# Patient Record
Sex: Male | Born: 1940
Health system: Southern US, Community
[De-identification: ages and names within clinical notes are randomized; demographics above are authoritative.]

## PROBLEM LIST (undated history)

## (undated) DIAGNOSIS — T8859XA Other complications of anesthesia, initial encounter: Secondary | ICD-10-CM

## (undated) DIAGNOSIS — I10 Essential (primary) hypertension: Secondary | ICD-10-CM

## (undated) DIAGNOSIS — J449 Chronic obstructive pulmonary disease, unspecified: Secondary | ICD-10-CM

## (undated) DIAGNOSIS — K4090 Unilateral inguinal hernia, without obstruction or gangrene, not specified as recurrent: Secondary | ICD-10-CM

## (undated) DIAGNOSIS — E119 Type 2 diabetes mellitus without complications: Secondary | ICD-10-CM

## (undated) HISTORY — PX: SHOULDER SURGERY: SHX246

## (undated) HISTORY — PX: APPENDECTOMY: SHX54

## (undated) HISTORY — PX: INGUINAL HERNIA REPAIR: SUR1180

---

## 2011-02-06 DIAGNOSIS — E876 Hypokalemia: Secondary | ICD-10-CM | POA: Diagnosis not present

## 2011-02-06 DIAGNOSIS — E291 Testicular hypofunction: Secondary | ICD-10-CM | POA: Diagnosis not present

## 2011-02-06 DIAGNOSIS — E7800 Pure hypercholesterolemia, unspecified: Secondary | ICD-10-CM | POA: Diagnosis not present

## 2011-02-06 DIAGNOSIS — I10 Essential (primary) hypertension: Secondary | ICD-10-CM | POA: Diagnosis not present

## 2011-02-13 DIAGNOSIS — Z79899 Other long term (current) drug therapy: Secondary | ICD-10-CM | POA: Diagnosis not present

## 2011-02-13 DIAGNOSIS — Z5181 Encounter for therapeutic drug level monitoring: Secondary | ICD-10-CM | POA: Diagnosis not present

## 2011-02-13 DIAGNOSIS — I10 Essential (primary) hypertension: Secondary | ICD-10-CM | POA: Diagnosis not present

## 2011-02-20 DIAGNOSIS — E7800 Pure hypercholesterolemia, unspecified: Secondary | ICD-10-CM | POA: Diagnosis not present

## 2011-02-20 DIAGNOSIS — E119 Type 2 diabetes mellitus without complications: Secondary | ICD-10-CM | POA: Diagnosis not present

## 2011-02-20 DIAGNOSIS — E291 Testicular hypofunction: Secondary | ICD-10-CM | POA: Diagnosis not present

## 2011-02-20 DIAGNOSIS — N19 Unspecified kidney failure: Secondary | ICD-10-CM | POA: Diagnosis not present

## 2011-02-24 DIAGNOSIS — H2510 Age-related nuclear cataract, unspecified eye: Secondary | ICD-10-CM | POA: Diagnosis not present

## 2011-02-24 DIAGNOSIS — E119 Type 2 diabetes mellitus without complications: Secondary | ICD-10-CM | POA: Diagnosis not present

## 2011-03-04 DIAGNOSIS — N179 Acute kidney failure, unspecified: Secondary | ICD-10-CM | POA: Diagnosis not present

## 2011-03-11 DIAGNOSIS — I129 Hypertensive chronic kidney disease with stage 1 through stage 4 chronic kidney disease, or unspecified chronic kidney disease: Secondary | ICD-10-CM | POA: Diagnosis not present

## 2011-03-11 DIAGNOSIS — N179 Acute kidney failure, unspecified: Secondary | ICD-10-CM | POA: Diagnosis not present

## 2011-03-11 DIAGNOSIS — N2581 Secondary hyperparathyroidism of renal origin: Secondary | ICD-10-CM | POA: Diagnosis not present

## 2011-03-17 DIAGNOSIS — J309 Allergic rhinitis, unspecified: Secondary | ICD-10-CM | POA: Diagnosis not present

## 2011-03-17 DIAGNOSIS — J61 Pneumoconiosis due to asbestos and other mineral fibers: Secondary | ICD-10-CM | POA: Diagnosis not present

## 2011-04-03 DIAGNOSIS — E291 Testicular hypofunction: Secondary | ICD-10-CM | POA: Diagnosis not present

## 2011-04-03 DIAGNOSIS — E119 Type 2 diabetes mellitus without complications: Secondary | ICD-10-CM | POA: Diagnosis not present

## 2011-04-03 DIAGNOSIS — N19 Unspecified kidney failure: Secondary | ICD-10-CM | POA: Diagnosis not present

## 2011-04-03 DIAGNOSIS — I10 Essential (primary) hypertension: Secondary | ICD-10-CM | POA: Diagnosis not present

## 2011-05-01 DIAGNOSIS — E291 Testicular hypofunction: Secondary | ICD-10-CM | POA: Diagnosis not present

## 2011-05-01 DIAGNOSIS — M545 Low back pain: Secondary | ICD-10-CM | POA: Diagnosis not present

## 2011-05-01 DIAGNOSIS — E119 Type 2 diabetes mellitus without complications: Secondary | ICD-10-CM | POA: Diagnosis not present

## 2011-05-01 DIAGNOSIS — I10 Essential (primary) hypertension: Secondary | ICD-10-CM | POA: Diagnosis not present

## 2011-05-22 DIAGNOSIS — N529 Male erectile dysfunction, unspecified: Secondary | ICD-10-CM | POA: Diagnosis not present

## 2011-05-22 DIAGNOSIS — Z79899 Other long term (current) drug therapy: Secondary | ICD-10-CM | POA: Diagnosis not present

## 2011-05-22 DIAGNOSIS — E782 Mixed hyperlipidemia: Secondary | ICD-10-CM | POA: Diagnosis not present

## 2011-05-22 DIAGNOSIS — I10 Essential (primary) hypertension: Secondary | ICD-10-CM | POA: Diagnosis not present

## 2011-05-22 DIAGNOSIS — E119 Type 2 diabetes mellitus without complications: Secondary | ICD-10-CM | POA: Diagnosis not present

## 2011-05-29 DIAGNOSIS — M545 Low back pain: Secondary | ICD-10-CM | POA: Diagnosis not present

## 2011-05-29 DIAGNOSIS — E291 Testicular hypofunction: Secondary | ICD-10-CM | POA: Diagnosis not present

## 2011-05-29 DIAGNOSIS — E119 Type 2 diabetes mellitus without complications: Secondary | ICD-10-CM | POA: Diagnosis not present

## 2011-05-29 DIAGNOSIS — E7800 Pure hypercholesterolemia, unspecified: Secondary | ICD-10-CM | POA: Diagnosis not present

## 2011-07-07 DIAGNOSIS — J61 Pneumoconiosis due to asbestos and other mineral fibers: Secondary | ICD-10-CM | POA: Diagnosis not present

## 2011-07-07 DIAGNOSIS — J309 Allergic rhinitis, unspecified: Secondary | ICD-10-CM | POA: Diagnosis not present

## 2011-07-31 DIAGNOSIS — E291 Testicular hypofunction: Secondary | ICD-10-CM | POA: Diagnosis not present

## 2011-07-31 DIAGNOSIS — E782 Mixed hyperlipidemia: Secondary | ICD-10-CM | POA: Diagnosis not present

## 2011-07-31 DIAGNOSIS — E119 Type 2 diabetes mellitus without complications: Secondary | ICD-10-CM | POA: Diagnosis not present

## 2011-07-31 DIAGNOSIS — IMO0001 Reserved for inherently not codable concepts without codable children: Secondary | ICD-10-CM | POA: Diagnosis not present

## 2011-07-31 DIAGNOSIS — E7800 Pure hypercholesterolemia, unspecified: Secondary | ICD-10-CM | POA: Diagnosis not present

## 2011-07-31 DIAGNOSIS — I10 Essential (primary) hypertension: Secondary | ICD-10-CM | POA: Diagnosis not present

## 2011-07-31 DIAGNOSIS — Z79899 Other long term (current) drug therapy: Secondary | ICD-10-CM | POA: Diagnosis not present

## 2011-09-17 DIAGNOSIS — E291 Testicular hypofunction: Secondary | ICD-10-CM | POA: Diagnosis not present

## 2011-09-17 DIAGNOSIS — E119 Type 2 diabetes mellitus without complications: Secondary | ICD-10-CM | POA: Diagnosis not present

## 2011-09-17 DIAGNOSIS — E782 Mixed hyperlipidemia: Secondary | ICD-10-CM | POA: Diagnosis not present

## 2011-09-17 DIAGNOSIS — I10 Essential (primary) hypertension: Secondary | ICD-10-CM | POA: Diagnosis not present

## 2011-10-07 DIAGNOSIS — J0100 Acute maxillary sinusitis, unspecified: Secondary | ICD-10-CM | POA: Diagnosis not present

## 2011-10-07 DIAGNOSIS — J209 Acute bronchitis, unspecified: Secondary | ICD-10-CM | POA: Diagnosis not present

## 2011-10-16 DIAGNOSIS — E291 Testicular hypofunction: Secondary | ICD-10-CM | POA: Diagnosis not present

## 2011-11-04 DIAGNOSIS — J61 Pneumoconiosis due to asbestos and other mineral fibers: Secondary | ICD-10-CM | POA: Diagnosis not present

## 2011-11-04 DIAGNOSIS — J45909 Unspecified asthma, uncomplicated: Secondary | ICD-10-CM | POA: Diagnosis not present

## 2011-11-04 DIAGNOSIS — J3089 Other allergic rhinitis: Secondary | ICD-10-CM | POA: Diagnosis not present

## 2011-11-16 DIAGNOSIS — E291 Testicular hypofunction: Secondary | ICD-10-CM | POA: Diagnosis not present

## 2011-11-27 DIAGNOSIS — E782 Mixed hyperlipidemia: Secondary | ICD-10-CM | POA: Diagnosis not present

## 2011-11-27 DIAGNOSIS — E119 Type 2 diabetes mellitus without complications: Secondary | ICD-10-CM | POA: Diagnosis not present

## 2011-11-27 DIAGNOSIS — I10 Essential (primary) hypertension: Secondary | ICD-10-CM | POA: Diagnosis not present

## 2011-11-27 DIAGNOSIS — Z23 Encounter for immunization: Secondary | ICD-10-CM | POA: Diagnosis not present

## 2011-12-22 DIAGNOSIS — E291 Testicular hypofunction: Secondary | ICD-10-CM | POA: Diagnosis not present

## 2012-01-26 DIAGNOSIS — E291 Testicular hypofunction: Secondary | ICD-10-CM | POA: Diagnosis not present

## 2012-03-01 DIAGNOSIS — E291 Testicular hypofunction: Secondary | ICD-10-CM | POA: Diagnosis not present

## 2012-03-10 DIAGNOSIS — N2581 Secondary hyperparathyroidism of renal origin: Secondary | ICD-10-CM | POA: Diagnosis not present

## 2012-03-10 DIAGNOSIS — R5381 Other malaise: Secondary | ICD-10-CM | POA: Diagnosis not present

## 2012-03-10 DIAGNOSIS — N179 Acute kidney failure, unspecified: Secondary | ICD-10-CM | POA: Diagnosis not present

## 2012-03-10 DIAGNOSIS — N183 Chronic kidney disease, stage 3 unspecified: Secondary | ICD-10-CM | POA: Diagnosis not present

## 2012-03-16 DIAGNOSIS — D649 Anemia, unspecified: Secondary | ICD-10-CM | POA: Diagnosis not present

## 2012-03-16 DIAGNOSIS — N2581 Secondary hyperparathyroidism of renal origin: Secondary | ICD-10-CM | POA: Diagnosis not present

## 2012-03-16 DIAGNOSIS — I129 Hypertensive chronic kidney disease with stage 1 through stage 4 chronic kidney disease, or unspecified chronic kidney disease: Secondary | ICD-10-CM | POA: Diagnosis not present

## 2012-03-22 DIAGNOSIS — J3089 Other allergic rhinitis: Secondary | ICD-10-CM | POA: Diagnosis not present

## 2012-03-22 DIAGNOSIS — J45909 Unspecified asthma, uncomplicated: Secondary | ICD-10-CM | POA: Diagnosis not present

## 2012-03-22 DIAGNOSIS — J61 Pneumoconiosis due to asbestos and other mineral fibers: Secondary | ICD-10-CM | POA: Diagnosis not present

## 2012-03-28 DIAGNOSIS — H2510 Age-related nuclear cataract, unspecified eye: Secondary | ICD-10-CM | POA: Diagnosis not present

## 2012-04-01 DIAGNOSIS — I10 Essential (primary) hypertension: Secondary | ICD-10-CM | POA: Diagnosis not present

## 2012-04-01 DIAGNOSIS — E782 Mixed hyperlipidemia: Secondary | ICD-10-CM | POA: Diagnosis not present

## 2012-04-01 DIAGNOSIS — E1165 Type 2 diabetes mellitus with hyperglycemia: Secondary | ICD-10-CM | POA: Diagnosis not present

## 2012-04-01 DIAGNOSIS — E291 Testicular hypofunction: Secondary | ICD-10-CM | POA: Diagnosis not present

## 2012-04-01 DIAGNOSIS — E119 Type 2 diabetes mellitus without complications: Secondary | ICD-10-CM | POA: Diagnosis not present

## 2012-04-01 DIAGNOSIS — E1129 Type 2 diabetes mellitus with other diabetic kidney complication: Secondary | ICD-10-CM | POA: Diagnosis not present

## 2012-04-13 DIAGNOSIS — N189 Chronic kidney disease, unspecified: Secondary | ICD-10-CM | POA: Diagnosis not present

## 2012-04-13 DIAGNOSIS — R748 Abnormal levels of other serum enzymes: Secondary | ICD-10-CM | POA: Diagnosis not present

## 2012-04-13 DIAGNOSIS — E1165 Type 2 diabetes mellitus with hyperglycemia: Secondary | ICD-10-CM | POA: Diagnosis not present

## 2012-04-13 DIAGNOSIS — E1129 Type 2 diabetes mellitus with other diabetic kidney complication: Secondary | ICD-10-CM | POA: Diagnosis not present

## 2012-04-14 DIAGNOSIS — J209 Acute bronchitis, unspecified: Secondary | ICD-10-CM | POA: Diagnosis not present

## 2012-04-14 DIAGNOSIS — J069 Acute upper respiratory infection, unspecified: Secondary | ICD-10-CM | POA: Diagnosis not present

## 2012-04-14 DIAGNOSIS — E119 Type 2 diabetes mellitus without complications: Secondary | ICD-10-CM | POA: Diagnosis not present

## 2012-05-02 DIAGNOSIS — N189 Chronic kidney disease, unspecified: Secondary | ICD-10-CM | POA: Diagnosis not present

## 2012-05-02 DIAGNOSIS — E291 Testicular hypofunction: Secondary | ICD-10-CM | POA: Diagnosis not present

## 2012-06-01 DIAGNOSIS — E291 Testicular hypofunction: Secondary | ICD-10-CM | POA: Diagnosis not present

## 2012-06-17 DIAGNOSIS — J61 Pneumoconiosis due to asbestos and other mineral fibers: Secondary | ICD-10-CM | POA: Diagnosis not present

## 2012-06-17 DIAGNOSIS — J45909 Unspecified asthma, uncomplicated: Secondary | ICD-10-CM | POA: Diagnosis not present

## 2012-06-17 DIAGNOSIS — J309 Allergic rhinitis, unspecified: Secondary | ICD-10-CM | POA: Diagnosis not present

## 2012-07-04 DIAGNOSIS — E291 Testicular hypofunction: Secondary | ICD-10-CM | POA: Diagnosis not present

## 2012-07-25 DIAGNOSIS — E1129 Type 2 diabetes mellitus with other diabetic kidney complication: Secondary | ICD-10-CM | POA: Diagnosis not present

## 2012-07-25 DIAGNOSIS — E1165 Type 2 diabetes mellitus with hyperglycemia: Secondary | ICD-10-CM | POA: Diagnosis not present

## 2012-07-25 DIAGNOSIS — E782 Mixed hyperlipidemia: Secondary | ICD-10-CM | POA: Diagnosis not present

## 2012-07-25 DIAGNOSIS — I10 Essential (primary) hypertension: Secondary | ICD-10-CM | POA: Diagnosis not present

## 2012-08-01 DIAGNOSIS — Z1331 Encounter for screening for depression: Secondary | ICD-10-CM | POA: Diagnosis not present

## 2012-08-01 DIAGNOSIS — E291 Testicular hypofunction: Secondary | ICD-10-CM | POA: Diagnosis not present

## 2012-08-01 DIAGNOSIS — E782 Mixed hyperlipidemia: Secondary | ICD-10-CM | POA: Diagnosis not present

## 2012-08-01 DIAGNOSIS — Z139 Encounter for screening, unspecified: Secondary | ICD-10-CM | POA: Diagnosis not present

## 2012-08-01 DIAGNOSIS — E1129 Type 2 diabetes mellitus with other diabetic kidney complication: Secondary | ICD-10-CM | POA: Diagnosis not present

## 2012-08-01 DIAGNOSIS — I10 Essential (primary) hypertension: Secondary | ICD-10-CM | POA: Diagnosis not present

## 2012-09-01 DIAGNOSIS — E291 Testicular hypofunction: Secondary | ICD-10-CM | POA: Diagnosis not present

## 2012-09-30 DIAGNOSIS — J45909 Unspecified asthma, uncomplicated: Secondary | ICD-10-CM | POA: Diagnosis not present

## 2012-09-30 DIAGNOSIS — Z23 Encounter for immunization: Secondary | ICD-10-CM | POA: Diagnosis not present

## 2012-09-30 DIAGNOSIS — J61 Pneumoconiosis due to asbestos and other mineral fibers: Secondary | ICD-10-CM | POA: Diagnosis not present

## 2012-09-30 DIAGNOSIS — J309 Allergic rhinitis, unspecified: Secondary | ICD-10-CM | POA: Diagnosis not present

## 2012-10-06 DIAGNOSIS — Z23 Encounter for immunization: Secondary | ICD-10-CM | POA: Diagnosis not present

## 2012-10-06 DIAGNOSIS — E291 Testicular hypofunction: Secondary | ICD-10-CM | POA: Diagnosis not present

## 2012-11-07 DIAGNOSIS — E291 Testicular hypofunction: Secondary | ICD-10-CM | POA: Diagnosis not present

## 2012-12-05 DIAGNOSIS — Z125 Encounter for screening for malignant neoplasm of prostate: Secondary | ICD-10-CM | POA: Diagnosis not present

## 2012-12-05 DIAGNOSIS — E782 Mixed hyperlipidemia: Secondary | ICD-10-CM | POA: Diagnosis not present

## 2012-12-05 DIAGNOSIS — E1129 Type 2 diabetes mellitus with other diabetic kidney complication: Secondary | ICD-10-CM | POA: Diagnosis not present

## 2012-12-05 DIAGNOSIS — I10 Essential (primary) hypertension: Secondary | ICD-10-CM | POA: Diagnosis not present

## 2012-12-06 DIAGNOSIS — E875 Hyperkalemia: Secondary | ICD-10-CM | POA: Diagnosis not present

## 2012-12-12 DIAGNOSIS — I10 Essential (primary) hypertension: Secondary | ICD-10-CM | POA: Diagnosis not present

## 2012-12-12 DIAGNOSIS — E291 Testicular hypofunction: Secondary | ICD-10-CM | POA: Diagnosis not present

## 2012-12-12 DIAGNOSIS — E782 Mixed hyperlipidemia: Secondary | ICD-10-CM | POA: Diagnosis not present

## 2012-12-12 DIAGNOSIS — E1129 Type 2 diabetes mellitus with other diabetic kidney complication: Secondary | ICD-10-CM | POA: Diagnosis not present

## 2013-01-16 DIAGNOSIS — E291 Testicular hypofunction: Secondary | ICD-10-CM | POA: Diagnosis not present

## 2013-01-20 DIAGNOSIS — J309 Allergic rhinitis, unspecified: Secondary | ICD-10-CM | POA: Diagnosis not present

## 2013-01-20 DIAGNOSIS — J61 Pneumoconiosis due to asbestos and other mineral fibers: Secondary | ICD-10-CM | POA: Diagnosis not present

## 2013-01-20 DIAGNOSIS — J45909 Unspecified asthma, uncomplicated: Secondary | ICD-10-CM | POA: Diagnosis not present

## 2013-02-16 DIAGNOSIS — E291 Testicular hypofunction: Secondary | ICD-10-CM | POA: Diagnosis not present

## 2013-03-17 DIAGNOSIS — E291 Testicular hypofunction: Secondary | ICD-10-CM | POA: Diagnosis not present

## 2013-04-25 DIAGNOSIS — I10 Essential (primary) hypertension: Secondary | ICD-10-CM | POA: Diagnosis not present

## 2013-04-25 DIAGNOSIS — E1129 Type 2 diabetes mellitus with other diabetic kidney complication: Secondary | ICD-10-CM | POA: Diagnosis not present

## 2013-04-25 DIAGNOSIS — E291 Testicular hypofunction: Secondary | ICD-10-CM | POA: Diagnosis not present

## 2013-04-25 DIAGNOSIS — E782 Mixed hyperlipidemia: Secondary | ICD-10-CM | POA: Diagnosis not present

## 2013-05-02 DIAGNOSIS — E782 Mixed hyperlipidemia: Secondary | ICD-10-CM | POA: Diagnosis not present

## 2013-05-02 DIAGNOSIS — E1129 Type 2 diabetes mellitus with other diabetic kidney complication: Secondary | ICD-10-CM | POA: Diagnosis not present

## 2013-05-02 DIAGNOSIS — Z6830 Body mass index (BMI) 30.0-30.9, adult: Secondary | ICD-10-CM | POA: Diagnosis not present

## 2013-05-02 DIAGNOSIS — I10 Essential (primary) hypertension: Secondary | ICD-10-CM | POA: Diagnosis not present

## 2013-05-12 DIAGNOSIS — J45909 Unspecified asthma, uncomplicated: Secondary | ICD-10-CM | POA: Diagnosis not present

## 2013-05-12 DIAGNOSIS — J61 Pneumoconiosis due to asbestos and other mineral fibers: Secondary | ICD-10-CM | POA: Diagnosis not present

## 2013-05-12 DIAGNOSIS — J309 Allergic rhinitis, unspecified: Secondary | ICD-10-CM | POA: Diagnosis not present

## 2013-06-01 DIAGNOSIS — E559 Vitamin D deficiency, unspecified: Secondary | ICD-10-CM | POA: Diagnosis not present

## 2013-06-01 DIAGNOSIS — I10 Essential (primary) hypertension: Secondary | ICD-10-CM | POA: Diagnosis not present

## 2013-06-01 DIAGNOSIS — Z6830 Body mass index (BMI) 30.0-30.9, adult: Secondary | ICD-10-CM | POA: Diagnosis not present

## 2013-06-01 DIAGNOSIS — N183 Chronic kidney disease, stage 3 unspecified: Secondary | ICD-10-CM | POA: Diagnosis not present

## 2013-06-01 DIAGNOSIS — E291 Testicular hypofunction: Secondary | ICD-10-CM | POA: Diagnosis not present

## 2013-06-08 DIAGNOSIS — D631 Anemia in chronic kidney disease: Secondary | ICD-10-CM | POA: Diagnosis not present

## 2013-06-08 DIAGNOSIS — N183 Chronic kidney disease, stage 3 unspecified: Secondary | ICD-10-CM | POA: Diagnosis not present

## 2013-06-08 DIAGNOSIS — E559 Vitamin D deficiency, unspecified: Secondary | ICD-10-CM | POA: Diagnosis not present

## 2013-06-08 DIAGNOSIS — I129 Hypertensive chronic kidney disease with stage 1 through stage 4 chronic kidney disease, or unspecified chronic kidney disease: Secondary | ICD-10-CM | POA: Diagnosis not present

## 2013-07-03 DIAGNOSIS — E119 Type 2 diabetes mellitus without complications: Secondary | ICD-10-CM | POA: Diagnosis not present

## 2013-07-03 DIAGNOSIS — H2589 Other age-related cataract: Secondary | ICD-10-CM | POA: Diagnosis not present

## 2013-07-11 DIAGNOSIS — I10 Essential (primary) hypertension: Secondary | ICD-10-CM | POA: Diagnosis not present

## 2013-07-11 DIAGNOSIS — E291 Testicular hypofunction: Secondary | ICD-10-CM | POA: Diagnosis not present

## 2013-07-11 DIAGNOSIS — IMO0002 Reserved for concepts with insufficient information to code with codable children: Secondary | ICD-10-CM | POA: Diagnosis not present

## 2013-08-25 DIAGNOSIS — E782 Mixed hyperlipidemia: Secondary | ICD-10-CM | POA: Diagnosis not present

## 2013-08-25 DIAGNOSIS — E1129 Type 2 diabetes mellitus with other diabetic kidney complication: Secondary | ICD-10-CM | POA: Diagnosis not present

## 2013-08-25 DIAGNOSIS — I10 Essential (primary) hypertension: Secondary | ICD-10-CM | POA: Diagnosis not present

## 2013-08-25 DIAGNOSIS — E291 Testicular hypofunction: Secondary | ICD-10-CM | POA: Diagnosis not present

## 2013-09-01 DIAGNOSIS — Z1331 Encounter for screening for depression: Secondary | ICD-10-CM | POA: Diagnosis not present

## 2013-09-05 DIAGNOSIS — J45909 Unspecified asthma, uncomplicated: Secondary | ICD-10-CM | POA: Diagnosis not present

## 2013-09-05 DIAGNOSIS — J309 Allergic rhinitis, unspecified: Secondary | ICD-10-CM | POA: Diagnosis not present

## 2013-09-05 DIAGNOSIS — J61 Pneumoconiosis due to asbestos and other mineral fibers: Secondary | ICD-10-CM | POA: Diagnosis not present

## 2013-09-25 DIAGNOSIS — E291 Testicular hypofunction: Secondary | ICD-10-CM | POA: Diagnosis not present

## 2013-09-29 DIAGNOSIS — I10 Essential (primary) hypertension: Secondary | ICD-10-CM | POA: Diagnosis not present

## 2013-10-25 DIAGNOSIS — Z23 Encounter for immunization: Secondary | ICD-10-CM | POA: Diagnosis not present

## 2013-10-25 DIAGNOSIS — E291 Testicular hypofunction: Secondary | ICD-10-CM | POA: Diagnosis not present

## 2013-11-24 DIAGNOSIS — E291 Testicular hypofunction: Secondary | ICD-10-CM | POA: Diagnosis not present

## 2013-12-11 DIAGNOSIS — Z125 Encounter for screening for malignant neoplasm of prostate: Secondary | ICD-10-CM | POA: Diagnosis not present

## 2013-12-11 DIAGNOSIS — E291 Testicular hypofunction: Secondary | ICD-10-CM | POA: Diagnosis not present

## 2013-12-11 DIAGNOSIS — E1129 Type 2 diabetes mellitus with other diabetic kidney complication: Secondary | ICD-10-CM | POA: Diagnosis not present

## 2013-12-11 DIAGNOSIS — E1165 Type 2 diabetes mellitus with hyperglycemia: Secondary | ICD-10-CM | POA: Diagnosis not present

## 2013-12-11 DIAGNOSIS — E782 Mixed hyperlipidemia: Secondary | ICD-10-CM | POA: Diagnosis not present

## 2013-12-11 DIAGNOSIS — I10 Essential (primary) hypertension: Secondary | ICD-10-CM | POA: Diagnosis not present

## 2013-12-18 DIAGNOSIS — E119 Type 2 diabetes mellitus without complications: Secondary | ICD-10-CM | POA: Diagnosis not present

## 2013-12-18 DIAGNOSIS — I10 Essential (primary) hypertension: Secondary | ICD-10-CM | POA: Diagnosis not present

## 2013-12-18 DIAGNOSIS — B353 Tinea pedis: Secondary | ICD-10-CM | POA: Diagnosis not present

## 2013-12-18 DIAGNOSIS — R972 Elevated prostate specific antigen [PSA]: Secondary | ICD-10-CM | POA: Diagnosis not present

## 2014-01-09 DIAGNOSIS — R972 Elevated prostate specific antigen [PSA]: Secondary | ICD-10-CM | POA: Diagnosis not present

## 2014-01-09 DIAGNOSIS — N401 Enlarged prostate with lower urinary tract symptoms: Secondary | ICD-10-CM | POA: Diagnosis not present

## 2014-01-26 DIAGNOSIS — N401 Enlarged prostate with lower urinary tract symptoms: Secondary | ICD-10-CM | POA: Diagnosis not present

## 2014-01-26 DIAGNOSIS — R972 Elevated prostate specific antigen [PSA]: Secondary | ICD-10-CM | POA: Diagnosis not present

## 2014-02-06 DIAGNOSIS — J0100 Acute maxillary sinusitis, unspecified: Secondary | ICD-10-CM | POA: Diagnosis not present

## 2014-02-23 DIAGNOSIS — J301 Allergic rhinitis due to pollen: Secondary | ICD-10-CM | POA: Diagnosis not present

## 2014-02-23 DIAGNOSIS — J449 Chronic obstructive pulmonary disease, unspecified: Secondary | ICD-10-CM | POA: Diagnosis not present

## 2014-02-23 DIAGNOSIS — J4530 Mild persistent asthma, uncomplicated: Secondary | ICD-10-CM | POA: Diagnosis not present

## 2014-02-23 DIAGNOSIS — J61 Pneumoconiosis due to asbestos and other mineral fibers: Secondary | ICD-10-CM | POA: Diagnosis not present

## 2014-03-29 DIAGNOSIS — J0100 Acute maxillary sinusitis, unspecified: Secondary | ICD-10-CM | POA: Diagnosis not present

## 2014-04-17 DIAGNOSIS — E782 Mixed hyperlipidemia: Secondary | ICD-10-CM | POA: Diagnosis not present

## 2014-04-17 DIAGNOSIS — E1129 Type 2 diabetes mellitus with other diabetic kidney complication: Secondary | ICD-10-CM | POA: Diagnosis not present

## 2014-04-17 DIAGNOSIS — I10 Essential (primary) hypertension: Secondary | ICD-10-CM | POA: Diagnosis not present

## 2014-04-17 DIAGNOSIS — E1165 Type 2 diabetes mellitus with hyperglycemia: Secondary | ICD-10-CM | POA: Diagnosis not present

## 2014-04-30 DIAGNOSIS — E1129 Type 2 diabetes mellitus with other diabetic kidney complication: Secondary | ICD-10-CM | POA: Diagnosis not present

## 2014-04-30 DIAGNOSIS — E1169 Type 2 diabetes mellitus with other specified complication: Secondary | ICD-10-CM | POA: Diagnosis not present

## 2014-04-30 DIAGNOSIS — N182 Chronic kidney disease, stage 2 (mild): Secondary | ICD-10-CM | POA: Diagnosis not present

## 2014-04-30 DIAGNOSIS — E1165 Type 2 diabetes mellitus with hyperglycemia: Secondary | ICD-10-CM | POA: Diagnosis not present

## 2014-05-14 DIAGNOSIS — R972 Elevated prostate specific antigen [PSA]: Secondary | ICD-10-CM | POA: Diagnosis not present

## 2014-05-14 DIAGNOSIS — N401 Enlarged prostate with lower urinary tract symptoms: Secondary | ICD-10-CM | POA: Diagnosis not present

## 2014-05-17 DIAGNOSIS — E559 Vitamin D deficiency, unspecified: Secondary | ICD-10-CM | POA: Diagnosis not present

## 2014-05-17 DIAGNOSIS — N183 Chronic kidney disease, stage 3 (moderate): Secondary | ICD-10-CM | POA: Diagnosis not present

## 2014-05-17 DIAGNOSIS — I129 Hypertensive chronic kidney disease with stage 1 through stage 4 chronic kidney disease, or unspecified chronic kidney disease: Secondary | ICD-10-CM | POA: Diagnosis not present

## 2014-05-17 DIAGNOSIS — D631 Anemia in chronic kidney disease: Secondary | ICD-10-CM | POA: Diagnosis not present

## 2014-06-29 DIAGNOSIS — J4530 Mild persistent asthma, uncomplicated: Secondary | ICD-10-CM | POA: Diagnosis not present

## 2014-06-29 DIAGNOSIS — J61 Pneumoconiosis due to asbestos and other mineral fibers: Secondary | ICD-10-CM | POA: Diagnosis not present

## 2014-06-29 DIAGNOSIS — J449 Chronic obstructive pulmonary disease, unspecified: Secondary | ICD-10-CM | POA: Diagnosis not present

## 2014-06-29 DIAGNOSIS — J301 Allergic rhinitis due to pollen: Secondary | ICD-10-CM | POA: Diagnosis not present

## 2014-08-06 DIAGNOSIS — E1165 Type 2 diabetes mellitus with hyperglycemia: Secondary | ICD-10-CM | POA: Diagnosis not present

## 2014-08-06 DIAGNOSIS — E782 Mixed hyperlipidemia: Secondary | ICD-10-CM | POA: Diagnosis not present

## 2014-08-06 DIAGNOSIS — E1129 Type 2 diabetes mellitus with other diabetic kidney complication: Secondary | ICD-10-CM | POA: Diagnosis not present

## 2014-08-06 DIAGNOSIS — I10 Essential (primary) hypertension: Secondary | ICD-10-CM | POA: Diagnosis not present

## 2014-08-13 DIAGNOSIS — E1129 Type 2 diabetes mellitus with other diabetic kidney complication: Secondary | ICD-10-CM | POA: Diagnosis not present

## 2014-08-13 DIAGNOSIS — I10 Essential (primary) hypertension: Secondary | ICD-10-CM | POA: Diagnosis not present

## 2014-08-13 DIAGNOSIS — E1159 Type 2 diabetes mellitus with other circulatory complications: Secondary | ICD-10-CM | POA: Diagnosis not present

## 2014-08-13 DIAGNOSIS — E1169 Type 2 diabetes mellitus with other specified complication: Secondary | ICD-10-CM | POA: Diagnosis not present

## 2014-09-20 DIAGNOSIS — N401 Enlarged prostate with lower urinary tract symptoms: Secondary | ICD-10-CM | POA: Diagnosis not present

## 2014-09-20 DIAGNOSIS — R972 Elevated prostate specific antigen [PSA]: Secondary | ICD-10-CM | POA: Diagnosis not present

## 2014-10-04 DIAGNOSIS — N401 Enlarged prostate with lower urinary tract symptoms: Secondary | ICD-10-CM | POA: Diagnosis not present

## 2014-10-04 DIAGNOSIS — R972 Elevated prostate specific antigen [PSA]: Secondary | ICD-10-CM | POA: Diagnosis not present

## 2014-10-05 DIAGNOSIS — J301 Allergic rhinitis due to pollen: Secondary | ICD-10-CM | POA: Diagnosis not present

## 2014-10-05 DIAGNOSIS — J4530 Mild persistent asthma, uncomplicated: Secondary | ICD-10-CM | POA: Diagnosis not present

## 2014-10-05 DIAGNOSIS — J61 Pneumoconiosis due to asbestos and other mineral fibers: Secondary | ICD-10-CM | POA: Diagnosis not present

## 2014-10-05 DIAGNOSIS — J449 Chronic obstructive pulmonary disease, unspecified: Secondary | ICD-10-CM | POA: Diagnosis not present

## 2014-10-18 DIAGNOSIS — Z23 Encounter for immunization: Secondary | ICD-10-CM | POA: Diagnosis not present

## 2014-10-18 DIAGNOSIS — Z0000 Encounter for general adult medical examination without abnormal findings: Secondary | ICD-10-CM | POA: Diagnosis not present

## 2014-10-18 DIAGNOSIS — Z139 Encounter for screening, unspecified: Secondary | ICD-10-CM | POA: Diagnosis not present

## 2014-10-18 DIAGNOSIS — Z1389 Encounter for screening for other disorder: Secondary | ICD-10-CM | POA: Diagnosis not present

## 2014-10-29 DIAGNOSIS — R972 Elevated prostate specific antigen [PSA]: Secondary | ICD-10-CM | POA: Diagnosis not present

## 2014-10-29 DIAGNOSIS — N401 Enlarged prostate with lower urinary tract symptoms: Secondary | ICD-10-CM | POA: Diagnosis not present

## 2014-11-05 DIAGNOSIS — R972 Elevated prostate specific antigen [PSA]: Secondary | ICD-10-CM | POA: Diagnosis not present

## 2014-11-05 DIAGNOSIS — N401 Enlarged prostate with lower urinary tract symptoms: Secondary | ICD-10-CM | POA: Diagnosis not present

## 2014-12-10 DIAGNOSIS — E1129 Type 2 diabetes mellitus with other diabetic kidney complication: Secondary | ICD-10-CM | POA: Diagnosis not present

## 2014-12-10 DIAGNOSIS — I10 Essential (primary) hypertension: Secondary | ICD-10-CM | POA: Diagnosis not present

## 2014-12-10 DIAGNOSIS — E782 Mixed hyperlipidemia: Secondary | ICD-10-CM | POA: Diagnosis not present

## 2014-12-17 DIAGNOSIS — I10 Essential (primary) hypertension: Secondary | ICD-10-CM | POA: Diagnosis not present

## 2014-12-17 DIAGNOSIS — E1169 Type 2 diabetes mellitus with other specified complication: Secondary | ICD-10-CM | POA: Diagnosis not present

## 2014-12-17 DIAGNOSIS — E785 Hyperlipidemia, unspecified: Secondary | ICD-10-CM | POA: Diagnosis not present

## 2014-12-17 DIAGNOSIS — E1129 Type 2 diabetes mellitus with other diabetic kidney complication: Secondary | ICD-10-CM | POA: Diagnosis not present

## 2014-12-25 DIAGNOSIS — E119 Type 2 diabetes mellitus without complications: Secondary | ICD-10-CM | POA: Diagnosis not present

## 2015-01-07 DIAGNOSIS — J0100 Acute maxillary sinusitis, unspecified: Secondary | ICD-10-CM | POA: Diagnosis not present

## 2015-01-07 DIAGNOSIS — J210 Acute bronchiolitis due to respiratory syncytial virus: Secondary | ICD-10-CM | POA: Diagnosis not present

## 2015-01-07 DIAGNOSIS — R062 Wheezing: Secondary | ICD-10-CM | POA: Diagnosis not present

## 2015-01-08 DIAGNOSIS — J309 Allergic rhinitis, unspecified: Secondary | ICD-10-CM | POA: Diagnosis not present

## 2015-01-08 DIAGNOSIS — J61 Pneumoconiosis due to asbestos and other mineral fibers: Secondary | ICD-10-CM | POA: Diagnosis not present

## 2015-01-08 DIAGNOSIS — J449 Chronic obstructive pulmonary disease, unspecified: Secondary | ICD-10-CM | POA: Diagnosis not present

## 2015-03-08 DIAGNOSIS — N401 Enlarged prostate with lower urinary tract symptoms: Secondary | ICD-10-CM | POA: Diagnosis not present

## 2015-03-08 DIAGNOSIS — R972 Elevated prostate specific antigen [PSA]: Secondary | ICD-10-CM | POA: Diagnosis not present

## 2015-04-10 DIAGNOSIS — I10 Essential (primary) hypertension: Secondary | ICD-10-CM | POA: Diagnosis not present

## 2015-04-10 DIAGNOSIS — E1169 Type 2 diabetes mellitus with other specified complication: Secondary | ICD-10-CM | POA: Diagnosis not present

## 2015-04-10 DIAGNOSIS — E1129 Type 2 diabetes mellitus with other diabetic kidney complication: Secondary | ICD-10-CM | POA: Diagnosis not present

## 2015-04-16 DIAGNOSIS — E1129 Type 2 diabetes mellitus with other diabetic kidney complication: Secondary | ICD-10-CM | POA: Diagnosis not present

## 2015-04-16 DIAGNOSIS — I10 Essential (primary) hypertension: Secondary | ICD-10-CM | POA: Diagnosis not present

## 2015-04-16 DIAGNOSIS — Z7189 Other specified counseling: Secondary | ICD-10-CM | POA: Diagnosis not present

## 2015-04-16 DIAGNOSIS — E1159 Type 2 diabetes mellitus with other circulatory complications: Secondary | ICD-10-CM | POA: Diagnosis not present

## 2015-04-16 DIAGNOSIS — E782 Mixed hyperlipidemia: Secondary | ICD-10-CM | POA: Diagnosis not present

## 2015-04-17 DIAGNOSIS — J449 Chronic obstructive pulmonary disease, unspecified: Secondary | ICD-10-CM | POA: Diagnosis not present

## 2015-04-17 DIAGNOSIS — J61 Pneumoconiosis due to asbestos and other mineral fibers: Secondary | ICD-10-CM | POA: Diagnosis not present

## 2015-04-17 DIAGNOSIS — J301 Allergic rhinitis due to pollen: Secondary | ICD-10-CM | POA: Diagnosis not present

## 2015-04-23 DIAGNOSIS — N189 Chronic kidney disease, unspecified: Secondary | ICD-10-CM | POA: Diagnosis not present

## 2015-04-23 DIAGNOSIS — E782 Mixed hyperlipidemia: Secondary | ICD-10-CM | POA: Diagnosis not present

## 2015-04-23 DIAGNOSIS — I10 Essential (primary) hypertension: Secondary | ICD-10-CM | POA: Diagnosis not present

## 2015-04-23 DIAGNOSIS — E1129 Type 2 diabetes mellitus with other diabetic kidney complication: Secondary | ICD-10-CM | POA: Diagnosis not present

## 2015-05-16 DIAGNOSIS — I10 Essential (primary) hypertension: Secondary | ICD-10-CM | POA: Diagnosis not present

## 2015-05-16 DIAGNOSIS — E1159 Type 2 diabetes mellitus with other circulatory complications: Secondary | ICD-10-CM | POA: Diagnosis not present

## 2015-05-16 DIAGNOSIS — E1169 Type 2 diabetes mellitus with other specified complication: Secondary | ICD-10-CM | POA: Diagnosis not present

## 2015-05-16 DIAGNOSIS — E782 Mixed hyperlipidemia: Secondary | ICD-10-CM | POA: Diagnosis not present

## 2015-05-16 DIAGNOSIS — E1129 Type 2 diabetes mellitus with other diabetic kidney complication: Secondary | ICD-10-CM | POA: Diagnosis not present

## 2015-05-16 DIAGNOSIS — E785 Hyperlipidemia, unspecified: Secondary | ICD-10-CM | POA: Diagnosis not present

## 2015-06-13 DIAGNOSIS — N189 Chronic kidney disease, unspecified: Secondary | ICD-10-CM | POA: Diagnosis not present

## 2015-06-17 DIAGNOSIS — E1169 Type 2 diabetes mellitus with other specified complication: Secondary | ICD-10-CM | POA: Diagnosis not present

## 2015-06-17 DIAGNOSIS — E1129 Type 2 diabetes mellitus with other diabetic kidney complication: Secondary | ICD-10-CM | POA: Diagnosis not present

## 2015-06-17 DIAGNOSIS — E785 Hyperlipidemia, unspecified: Secondary | ICD-10-CM | POA: Diagnosis not present

## 2015-06-17 DIAGNOSIS — I10 Essential (primary) hypertension: Secondary | ICD-10-CM | POA: Diagnosis not present

## 2015-06-20 DIAGNOSIS — Z6824 Body mass index (BMI) 24.0-24.9, adult: Secondary | ICD-10-CM | POA: Diagnosis not present

## 2015-06-20 DIAGNOSIS — N183 Chronic kidney disease, stage 3 (moderate): Secondary | ICD-10-CM | POA: Diagnosis not present

## 2015-06-20 DIAGNOSIS — E1165 Type 2 diabetes mellitus with hyperglycemia: Secondary | ICD-10-CM | POA: Diagnosis not present

## 2015-07-25 DIAGNOSIS — E1129 Type 2 diabetes mellitus with other diabetic kidney complication: Secondary | ICD-10-CM | POA: Diagnosis not present

## 2015-07-25 DIAGNOSIS — I10 Essential (primary) hypertension: Secondary | ICD-10-CM | POA: Diagnosis not present

## 2015-07-25 DIAGNOSIS — E1169 Type 2 diabetes mellitus with other specified complication: Secondary | ICD-10-CM | POA: Diagnosis not present

## 2015-07-30 DIAGNOSIS — J301 Allergic rhinitis due to pollen: Secondary | ICD-10-CM | POA: Diagnosis not present

## 2015-07-30 DIAGNOSIS — J449 Chronic obstructive pulmonary disease, unspecified: Secondary | ICD-10-CM | POA: Diagnosis not present

## 2015-07-30 DIAGNOSIS — J61 Pneumoconiosis due to asbestos and other mineral fibers: Secondary | ICD-10-CM | POA: Diagnosis not present

## 2015-08-01 DIAGNOSIS — E1129 Type 2 diabetes mellitus with other diabetic kidney complication: Secondary | ICD-10-CM | POA: Diagnosis not present

## 2015-08-01 DIAGNOSIS — E785 Hyperlipidemia, unspecified: Secondary | ICD-10-CM | POA: Diagnosis not present

## 2015-08-01 DIAGNOSIS — E1169 Type 2 diabetes mellitus with other specified complication: Secondary | ICD-10-CM | POA: Diagnosis not present

## 2015-08-01 DIAGNOSIS — I10 Essential (primary) hypertension: Secondary | ICD-10-CM | POA: Diagnosis not present

## 2015-08-13 DIAGNOSIS — N183 Chronic kidney disease, stage 3 (moderate): Secondary | ICD-10-CM | POA: Diagnosis not present

## 2015-08-13 DIAGNOSIS — I129 Hypertensive chronic kidney disease with stage 1 through stage 4 chronic kidney disease, or unspecified chronic kidney disease: Secondary | ICD-10-CM | POA: Diagnosis not present

## 2015-08-13 DIAGNOSIS — D631 Anemia in chronic kidney disease: Secondary | ICD-10-CM | POA: Diagnosis not present

## 2015-08-13 DIAGNOSIS — E559 Vitamin D deficiency, unspecified: Secondary | ICD-10-CM | POA: Diagnosis not present

## 2015-09-14 DIAGNOSIS — M545 Low back pain: Secondary | ICD-10-CM | POA: Diagnosis not present

## 2015-10-08 DIAGNOSIS — N401 Enlarged prostate with lower urinary tract symptoms: Secondary | ICD-10-CM | POA: Diagnosis not present

## 2015-10-08 DIAGNOSIS — R972 Elevated prostate specific antigen [PSA]: Secondary | ICD-10-CM | POA: Diagnosis not present

## 2015-11-01 DIAGNOSIS — J61 Pneumoconiosis due to asbestos and other mineral fibers: Secondary | ICD-10-CM | POA: Diagnosis not present

## 2015-11-01 DIAGNOSIS — J449 Chronic obstructive pulmonary disease, unspecified: Secondary | ICD-10-CM | POA: Diagnosis not present

## 2015-11-01 DIAGNOSIS — J301 Allergic rhinitis due to pollen: Secondary | ICD-10-CM | POA: Diagnosis not present

## 2015-11-11 DIAGNOSIS — Z23 Encounter for immunization: Secondary | ICD-10-CM | POA: Diagnosis not present

## 2015-11-26 DIAGNOSIS — Z139 Encounter for screening, unspecified: Secondary | ICD-10-CM | POA: Diagnosis not present

## 2015-11-26 DIAGNOSIS — E1169 Type 2 diabetes mellitus with other specified complication: Secondary | ICD-10-CM | POA: Diagnosis not present

## 2015-11-26 DIAGNOSIS — Z0000 Encounter for general adult medical examination without abnormal findings: Secondary | ICD-10-CM | POA: Diagnosis not present

## 2015-11-26 DIAGNOSIS — Z9181 History of falling: Secondary | ICD-10-CM | POA: Diagnosis not present

## 2015-11-26 DIAGNOSIS — I10 Essential (primary) hypertension: Secondary | ICD-10-CM | POA: Diagnosis not present

## 2015-11-26 DIAGNOSIS — E1129 Type 2 diabetes mellitus with other diabetic kidney complication: Secondary | ICD-10-CM | POA: Diagnosis not present

## 2015-11-26 DIAGNOSIS — Z1389 Encounter for screening for other disorder: Secondary | ICD-10-CM | POA: Diagnosis not present

## 2015-12-03 DIAGNOSIS — I10 Essential (primary) hypertension: Secondary | ICD-10-CM | POA: Diagnosis not present

## 2015-12-03 DIAGNOSIS — E785 Hyperlipidemia, unspecified: Secondary | ICD-10-CM | POA: Diagnosis not present

## 2015-12-03 DIAGNOSIS — E1169 Type 2 diabetes mellitus with other specified complication: Secondary | ICD-10-CM | POA: Diagnosis not present

## 2015-12-03 DIAGNOSIS — E1129 Type 2 diabetes mellitus with other diabetic kidney complication: Secondary | ICD-10-CM | POA: Diagnosis not present

## 2015-12-31 DIAGNOSIS — N179 Acute kidney failure, unspecified: Secondary | ICD-10-CM | POA: Diagnosis not present

## 2016-01-01 DIAGNOSIS — N179 Acute kidney failure, unspecified: Secondary | ICD-10-CM | POA: Diagnosis not present

## 2016-01-03 DIAGNOSIS — J0100 Acute maxillary sinusitis, unspecified: Secondary | ICD-10-CM | POA: Diagnosis not present

## 2016-01-23 DIAGNOSIS — J0100 Acute maxillary sinusitis, unspecified: Secondary | ICD-10-CM | POA: Diagnosis not present

## 2016-01-23 DIAGNOSIS — J209 Acute bronchitis, unspecified: Secondary | ICD-10-CM | POA: Diagnosis not present

## 2016-02-04 DIAGNOSIS — J301 Allergic rhinitis due to pollen: Secondary | ICD-10-CM | POA: Diagnosis not present

## 2016-02-04 DIAGNOSIS — J449 Chronic obstructive pulmonary disease, unspecified: Secondary | ICD-10-CM | POA: Diagnosis not present

## 2016-02-04 DIAGNOSIS — J61 Pneumoconiosis due to asbestos and other mineral fibers: Secondary | ICD-10-CM | POA: Diagnosis not present

## 2016-02-10 DIAGNOSIS — E119 Type 2 diabetes mellitus without complications: Secondary | ICD-10-CM | POA: Diagnosis not present

## 2016-02-10 DIAGNOSIS — H25813 Combined forms of age-related cataract, bilateral: Secondary | ICD-10-CM | POA: Diagnosis not present

## 2016-04-01 DIAGNOSIS — I10 Essential (primary) hypertension: Secondary | ICD-10-CM | POA: Diagnosis not present

## 2016-04-01 DIAGNOSIS — E1129 Type 2 diabetes mellitus with other diabetic kidney complication: Secondary | ICD-10-CM | POA: Diagnosis not present

## 2016-04-01 DIAGNOSIS — E1169 Type 2 diabetes mellitus with other specified complication: Secondary | ICD-10-CM | POA: Diagnosis not present

## 2016-04-07 DIAGNOSIS — N401 Enlarged prostate with lower urinary tract symptoms: Secondary | ICD-10-CM | POA: Diagnosis not present

## 2016-04-07 DIAGNOSIS — R972 Elevated prostate specific antigen [PSA]: Secondary | ICD-10-CM | POA: Diagnosis not present

## 2016-04-15 DIAGNOSIS — E1165 Type 2 diabetes mellitus with hyperglycemia: Secondary | ICD-10-CM | POA: Diagnosis not present

## 2016-04-15 DIAGNOSIS — E1129 Type 2 diabetes mellitus with other diabetic kidney complication: Secondary | ICD-10-CM | POA: Diagnosis not present

## 2016-04-15 DIAGNOSIS — E1159 Type 2 diabetes mellitus with other circulatory complications: Secondary | ICD-10-CM | POA: Diagnosis not present

## 2016-04-15 DIAGNOSIS — I10 Essential (primary) hypertension: Secondary | ICD-10-CM | POA: Diagnosis not present

## 2016-05-03 DIAGNOSIS — J309 Allergic rhinitis, unspecified: Secondary | ICD-10-CM | POA: Diagnosis not present

## 2016-05-19 DIAGNOSIS — J301 Allergic rhinitis due to pollen: Secondary | ICD-10-CM | POA: Diagnosis not present

## 2016-05-19 DIAGNOSIS — J449 Chronic obstructive pulmonary disease, unspecified: Secondary | ICD-10-CM | POA: Diagnosis not present

## 2016-05-19 DIAGNOSIS — J61 Pneumoconiosis due to asbestos and other mineral fibers: Secondary | ICD-10-CM | POA: Diagnosis not present

## 2016-07-16 DIAGNOSIS — E1129 Type 2 diabetes mellitus with other diabetic kidney complication: Secondary | ICD-10-CM | POA: Diagnosis not present

## 2016-07-16 DIAGNOSIS — E1169 Type 2 diabetes mellitus with other specified complication: Secondary | ICD-10-CM | POA: Diagnosis not present

## 2016-07-16 DIAGNOSIS — E1159 Type 2 diabetes mellitus with other circulatory complications: Secondary | ICD-10-CM | POA: Diagnosis not present

## 2016-07-21 DIAGNOSIS — R0602 Shortness of breath: Secondary | ICD-10-CM | POA: Diagnosis not present

## 2016-07-21 DIAGNOSIS — R05 Cough: Secondary | ICD-10-CM | POA: Diagnosis not present

## 2016-07-21 DIAGNOSIS — R112 Nausea with vomiting, unspecified: Secondary | ICD-10-CM | POA: Diagnosis not present

## 2016-07-27 DIAGNOSIS — E1159 Type 2 diabetes mellitus with other circulatory complications: Secondary | ICD-10-CM | POA: Diagnosis not present

## 2016-07-27 DIAGNOSIS — F3160 Bipolar disorder, current episode mixed, unspecified: Secondary | ICD-10-CM | POA: Diagnosis not present

## 2016-07-27 DIAGNOSIS — Z139 Encounter for screening, unspecified: Secondary | ICD-10-CM | POA: Diagnosis not present

## 2016-07-27 DIAGNOSIS — I10 Essential (primary) hypertension: Secondary | ICD-10-CM | POA: Diagnosis not present

## 2016-07-27 DIAGNOSIS — E1169 Type 2 diabetes mellitus with other specified complication: Secondary | ICD-10-CM | POA: Diagnosis not present

## 2016-07-27 DIAGNOSIS — F310 Bipolar disorder, current episode hypomanic: Secondary | ICD-10-CM | POA: Diagnosis not present

## 2016-07-27 DIAGNOSIS — E1129 Type 2 diabetes mellitus with other diabetic kidney complication: Secondary | ICD-10-CM | POA: Diagnosis not present

## 2016-08-24 DIAGNOSIS — N183 Chronic kidney disease, stage 3 (moderate): Secondary | ICD-10-CM | POA: Diagnosis not present

## 2016-08-24 DIAGNOSIS — E559 Vitamin D deficiency, unspecified: Secondary | ICD-10-CM | POA: Diagnosis not present

## 2016-08-24 DIAGNOSIS — N189 Chronic kidney disease, unspecified: Secondary | ICD-10-CM | POA: Diagnosis not present

## 2016-09-01 DIAGNOSIS — N183 Chronic kidney disease, stage 3 (moderate): Secondary | ICD-10-CM | POA: Diagnosis not present

## 2016-09-01 DIAGNOSIS — D631 Anemia in chronic kidney disease: Secondary | ICD-10-CM | POA: Diagnosis not present

## 2016-09-01 DIAGNOSIS — E559 Vitamin D deficiency, unspecified: Secondary | ICD-10-CM | POA: Diagnosis not present

## 2016-09-01 DIAGNOSIS — I129 Hypertensive chronic kidney disease with stage 1 through stage 4 chronic kidney disease, or unspecified chronic kidney disease: Secondary | ICD-10-CM | POA: Diagnosis not present

## 2016-10-08 DIAGNOSIS — N401 Enlarged prostate with lower urinary tract symptoms: Secondary | ICD-10-CM | POA: Diagnosis not present

## 2016-10-08 DIAGNOSIS — R339 Retention of urine, unspecified: Secondary | ICD-10-CM | POA: Diagnosis not present

## 2016-10-08 DIAGNOSIS — R972 Elevated prostate specific antigen [PSA]: Secondary | ICD-10-CM | POA: Diagnosis not present

## 2016-10-12 DIAGNOSIS — N183 Chronic kidney disease, stage 3 (moderate): Secondary | ICD-10-CM | POA: Diagnosis not present

## 2016-10-18 DIAGNOSIS — A059 Bacterial foodborne intoxication, unspecified: Secondary | ICD-10-CM | POA: Diagnosis not present

## 2016-10-20 DIAGNOSIS — N183 Chronic kidney disease, stage 3 (moderate): Secondary | ICD-10-CM | POA: Diagnosis not present

## 2016-10-20 DIAGNOSIS — D631 Anemia in chronic kidney disease: Secondary | ICD-10-CM | POA: Diagnosis not present

## 2016-10-20 DIAGNOSIS — E559 Vitamin D deficiency, unspecified: Secondary | ICD-10-CM | POA: Diagnosis not present

## 2016-10-20 DIAGNOSIS — I129 Hypertensive chronic kidney disease with stage 1 through stage 4 chronic kidney disease, or unspecified chronic kidney disease: Secondary | ICD-10-CM | POA: Diagnosis not present

## 2016-11-02 DIAGNOSIS — Z23 Encounter for immunization: Secondary | ICD-10-CM | POA: Diagnosis not present

## 2016-11-20 DIAGNOSIS — J301 Allergic rhinitis due to pollen: Secondary | ICD-10-CM | POA: Diagnosis not present

## 2016-11-20 DIAGNOSIS — J61 Pneumoconiosis due to asbestos and other mineral fibers: Secondary | ICD-10-CM | POA: Diagnosis not present

## 2016-11-20 DIAGNOSIS — J4530 Mild persistent asthma, uncomplicated: Secondary | ICD-10-CM | POA: Diagnosis not present

## 2016-11-23 DIAGNOSIS — E1169 Type 2 diabetes mellitus with other specified complication: Secondary | ICD-10-CM | POA: Diagnosis not present

## 2016-11-23 DIAGNOSIS — E1129 Type 2 diabetes mellitus with other diabetic kidney complication: Secondary | ICD-10-CM | POA: Diagnosis not present

## 2016-11-23 DIAGNOSIS — E1159 Type 2 diabetes mellitus with other circulatory complications: Secondary | ICD-10-CM | POA: Diagnosis not present

## 2016-12-02 DIAGNOSIS — Z139 Encounter for screening, unspecified: Secondary | ICD-10-CM | POA: Diagnosis not present

## 2016-12-02 DIAGNOSIS — I10 Essential (primary) hypertension: Secondary | ICD-10-CM | POA: Diagnosis not present

## 2016-12-02 DIAGNOSIS — E1159 Type 2 diabetes mellitus with other circulatory complications: Secondary | ICD-10-CM | POA: Diagnosis not present

## 2016-12-02 DIAGNOSIS — E1129 Type 2 diabetes mellitus with other diabetic kidney complication: Secondary | ICD-10-CM | POA: Diagnosis not present

## 2016-12-02 DIAGNOSIS — Z1331 Encounter for screening for depression: Secondary | ICD-10-CM | POA: Diagnosis not present

## 2016-12-02 DIAGNOSIS — Z0000 Encounter for general adult medical examination without abnormal findings: Secondary | ICD-10-CM | POA: Diagnosis not present

## 2016-12-02 DIAGNOSIS — E1169 Type 2 diabetes mellitus with other specified complication: Secondary | ICD-10-CM | POA: Diagnosis not present

## 2016-12-10 DIAGNOSIS — E1129 Type 2 diabetes mellitus with other diabetic kidney complication: Secondary | ICD-10-CM | POA: Diagnosis not present

## 2016-12-14 DIAGNOSIS — J0100 Acute maxillary sinusitis, unspecified: Secondary | ICD-10-CM | POA: Diagnosis not present

## 2017-01-14 DIAGNOSIS — E1129 Type 2 diabetes mellitus with other diabetic kidney complication: Secondary | ICD-10-CM | POA: Diagnosis not present

## 2017-02-16 DIAGNOSIS — E785 Hyperlipidemia, unspecified: Secondary | ICD-10-CM | POA: Diagnosis not present

## 2017-02-16 DIAGNOSIS — I10 Essential (primary) hypertension: Secondary | ICD-10-CM | POA: Diagnosis not present

## 2017-02-16 DIAGNOSIS — E1129 Type 2 diabetes mellitus with other diabetic kidney complication: Secondary | ICD-10-CM | POA: Diagnosis not present

## 2017-02-16 DIAGNOSIS — N189 Chronic kidney disease, unspecified: Secondary | ICD-10-CM | POA: Diagnosis not present

## 2017-02-20 DIAGNOSIS — J111 Influenza due to unidentified influenza virus with other respiratory manifestations: Secondary | ICD-10-CM | POA: Diagnosis not present

## 2017-03-11 DIAGNOSIS — E113291 Type 2 diabetes mellitus with mild nonproliferative diabetic retinopathy without macular edema, right eye: Secondary | ICD-10-CM | POA: Diagnosis not present

## 2017-03-11 DIAGNOSIS — H25813 Combined forms of age-related cataract, bilateral: Secondary | ICD-10-CM | POA: Diagnosis not present

## 2017-03-23 DIAGNOSIS — E1159 Type 2 diabetes mellitus with other circulatory complications: Secondary | ICD-10-CM | POA: Diagnosis not present

## 2017-03-23 DIAGNOSIS — I130 Hypertensive heart and chronic kidney disease with heart failure and stage 1 through stage 4 chronic kidney disease, or unspecified chronic kidney disease: Secondary | ICD-10-CM | POA: Diagnosis not present

## 2017-03-26 DIAGNOSIS — J4530 Mild persistent asthma, uncomplicated: Secondary | ICD-10-CM | POA: Diagnosis not present

## 2017-03-26 DIAGNOSIS — J301 Allergic rhinitis due to pollen: Secondary | ICD-10-CM | POA: Diagnosis not present

## 2017-03-26 DIAGNOSIS — J61 Pneumoconiosis due to asbestos and other mineral fibers: Secondary | ICD-10-CM | POA: Diagnosis not present

## 2017-03-29 DIAGNOSIS — E1159 Type 2 diabetes mellitus with other circulatory complications: Secondary | ICD-10-CM | POA: Diagnosis not present

## 2017-03-29 DIAGNOSIS — E1169 Type 2 diabetes mellitus with other specified complication: Secondary | ICD-10-CM | POA: Diagnosis not present

## 2017-03-29 DIAGNOSIS — E1129 Type 2 diabetes mellitus with other diabetic kidney complication: Secondary | ICD-10-CM | POA: Diagnosis not present

## 2017-04-02 DIAGNOSIS — E785 Hyperlipidemia, unspecified: Secondary | ICD-10-CM | POA: Diagnosis not present

## 2017-04-02 DIAGNOSIS — E1129 Type 2 diabetes mellitus with other diabetic kidney complication: Secondary | ICD-10-CM | POA: Diagnosis not present

## 2017-04-02 DIAGNOSIS — Z789 Other specified health status: Secondary | ICD-10-CM | POA: Diagnosis not present

## 2017-04-02 DIAGNOSIS — E1169 Type 2 diabetes mellitus with other specified complication: Secondary | ICD-10-CM | POA: Diagnosis not present

## 2017-04-02 DIAGNOSIS — E1159 Type 2 diabetes mellitus with other circulatory complications: Secondary | ICD-10-CM | POA: Diagnosis not present

## 2017-04-07 DIAGNOSIS — R972 Elevated prostate specific antigen [PSA]: Secondary | ICD-10-CM | POA: Diagnosis not present

## 2017-04-07 DIAGNOSIS — R339 Retention of urine, unspecified: Secondary | ICD-10-CM | POA: Diagnosis not present

## 2017-04-07 DIAGNOSIS — N401 Enlarged prostate with lower urinary tract symptoms: Secondary | ICD-10-CM | POA: Diagnosis not present

## 2017-04-26 DIAGNOSIS — J449 Chronic obstructive pulmonary disease, unspecified: Secondary | ICD-10-CM | POA: Diagnosis not present

## 2017-04-26 DIAGNOSIS — N189 Chronic kidney disease, unspecified: Secondary | ICD-10-CM | POA: Diagnosis not present

## 2017-04-26 DIAGNOSIS — E559 Vitamin D deficiency, unspecified: Secondary | ICD-10-CM | POA: Diagnosis not present

## 2017-04-26 DIAGNOSIS — N183 Chronic kidney disease, stage 3 (moderate): Secondary | ICD-10-CM | POA: Diagnosis not present

## 2017-04-26 DIAGNOSIS — J301 Allergic rhinitis due to pollen: Secondary | ICD-10-CM | POA: Diagnosis not present

## 2017-04-27 DIAGNOSIS — Z01818 Encounter for other preprocedural examination: Secondary | ICD-10-CM | POA: Diagnosis not present

## 2017-04-27 DIAGNOSIS — H25812 Combined forms of age-related cataract, left eye: Secondary | ICD-10-CM | POA: Diagnosis not present

## 2017-05-05 DIAGNOSIS — N183 Chronic kidney disease, stage 3 (moderate): Secondary | ICD-10-CM | POA: Diagnosis not present

## 2017-05-05 DIAGNOSIS — D631 Anemia in chronic kidney disease: Secondary | ICD-10-CM | POA: Diagnosis not present

## 2017-05-05 DIAGNOSIS — I129 Hypertensive chronic kidney disease with stage 1 through stage 4 chronic kidney disease, or unspecified chronic kidney disease: Secondary | ICD-10-CM | POA: Diagnosis not present

## 2017-05-05 DIAGNOSIS — E559 Vitamin D deficiency, unspecified: Secondary | ICD-10-CM | POA: Diagnosis not present

## 2017-05-11 DIAGNOSIS — E785 Hyperlipidemia, unspecified: Secondary | ICD-10-CM | POA: Diagnosis not present

## 2017-05-11 DIAGNOSIS — Z7951 Long term (current) use of inhaled steroids: Secondary | ICD-10-CM | POA: Diagnosis not present

## 2017-05-11 DIAGNOSIS — E1129 Type 2 diabetes mellitus with other diabetic kidney complication: Secondary | ICD-10-CM | POA: Diagnosis not present

## 2017-05-11 DIAGNOSIS — N183 Chronic kidney disease, stage 3 (moderate): Secondary | ICD-10-CM | POA: Diagnosis not present

## 2017-05-11 DIAGNOSIS — Z79899 Other long term (current) drug therapy: Secondary | ICD-10-CM | POA: Diagnosis not present

## 2017-05-11 DIAGNOSIS — E119 Type 2 diabetes mellitus without complications: Secondary | ICD-10-CM | POA: Diagnosis not present

## 2017-05-11 DIAGNOSIS — H25812 Combined forms of age-related cataract, left eye: Secondary | ICD-10-CM | POA: Diagnosis not present

## 2017-05-11 DIAGNOSIS — H259 Unspecified age-related cataract: Secondary | ICD-10-CM | POA: Diagnosis not present

## 2017-05-11 DIAGNOSIS — E1136 Type 2 diabetes mellitus with diabetic cataract: Secondary | ICD-10-CM | POA: Diagnosis not present

## 2017-05-11 DIAGNOSIS — Z7982 Long term (current) use of aspirin: Secondary | ICD-10-CM | POA: Diagnosis not present

## 2017-05-11 DIAGNOSIS — J449 Chronic obstructive pulmonary disease, unspecified: Secondary | ICD-10-CM | POA: Diagnosis not present

## 2017-05-11 DIAGNOSIS — I10 Essential (primary) hypertension: Secondary | ICD-10-CM | POA: Diagnosis not present

## 2017-05-11 DIAGNOSIS — Z87891 Personal history of nicotine dependence: Secondary | ICD-10-CM | POA: Diagnosis not present

## 2017-05-11 DIAGNOSIS — H52223 Regular astigmatism, bilateral: Secondary | ICD-10-CM | POA: Diagnosis not present

## 2017-05-20 DIAGNOSIS — D631 Anemia in chronic kidney disease: Secondary | ICD-10-CM | POA: Diagnosis not present

## 2017-05-20 DIAGNOSIS — N189 Chronic kidney disease, unspecified: Secondary | ICD-10-CM | POA: Diagnosis not present

## 2017-05-25 DIAGNOSIS — E119 Type 2 diabetes mellitus without complications: Secondary | ICD-10-CM | POA: Diagnosis not present

## 2017-05-25 DIAGNOSIS — M1990 Unspecified osteoarthritis, unspecified site: Secondary | ICD-10-CM | POA: Diagnosis not present

## 2017-05-25 DIAGNOSIS — Z79899 Other long term (current) drug therapy: Secondary | ICD-10-CM | POA: Diagnosis not present

## 2017-05-25 DIAGNOSIS — J449 Chronic obstructive pulmonary disease, unspecified: Secondary | ICD-10-CM | POA: Diagnosis not present

## 2017-05-25 DIAGNOSIS — Z7984 Long term (current) use of oral hypoglycemic drugs: Secondary | ICD-10-CM | POA: Diagnosis not present

## 2017-05-25 DIAGNOSIS — Z7982 Long term (current) use of aspirin: Secondary | ICD-10-CM | POA: Diagnosis not present

## 2017-05-25 DIAGNOSIS — F17210 Nicotine dependence, cigarettes, uncomplicated: Secondary | ICD-10-CM | POA: Diagnosis not present

## 2017-05-25 DIAGNOSIS — Z7951 Long term (current) use of inhaled steroids: Secondary | ICD-10-CM | POA: Diagnosis not present

## 2017-05-25 DIAGNOSIS — E785 Hyperlipidemia, unspecified: Secondary | ICD-10-CM | POA: Diagnosis not present

## 2017-05-25 DIAGNOSIS — N400 Enlarged prostate without lower urinary tract symptoms: Secondary | ICD-10-CM | POA: Diagnosis not present

## 2017-05-25 DIAGNOSIS — I10 Essential (primary) hypertension: Secondary | ICD-10-CM | POA: Diagnosis not present

## 2017-05-25 DIAGNOSIS — H25811 Combined forms of age-related cataract, right eye: Secondary | ICD-10-CM | POA: Diagnosis not present

## 2017-05-28 DIAGNOSIS — D631 Anemia in chronic kidney disease: Secondary | ICD-10-CM | POA: Diagnosis not present

## 2017-05-28 DIAGNOSIS — N189 Chronic kidney disease, unspecified: Secondary | ICD-10-CM | POA: Diagnosis not present

## 2017-06-10 DIAGNOSIS — Z961 Presence of intraocular lens: Secondary | ICD-10-CM | POA: Diagnosis not present

## 2017-06-11 DIAGNOSIS — E785 Hyperlipidemia, unspecified: Secondary | ICD-10-CM | POA: Diagnosis not present

## 2017-06-11 DIAGNOSIS — E1129 Type 2 diabetes mellitus with other diabetic kidney complication: Secondary | ICD-10-CM | POA: Diagnosis not present

## 2017-06-11 DIAGNOSIS — N183 Chronic kidney disease, stage 3 (moderate): Secondary | ICD-10-CM | POA: Diagnosis not present

## 2017-07-11 DIAGNOSIS — N183 Chronic kidney disease, stage 3 (moderate): Secondary | ICD-10-CM | POA: Diagnosis not present

## 2017-07-11 DIAGNOSIS — E1129 Type 2 diabetes mellitus with other diabetic kidney complication: Secondary | ICD-10-CM | POA: Diagnosis not present

## 2017-07-11 DIAGNOSIS — E785 Hyperlipidemia, unspecified: Secondary | ICD-10-CM | POA: Diagnosis not present

## 2017-07-13 DIAGNOSIS — E1169 Type 2 diabetes mellitus with other specified complication: Secondary | ICD-10-CM | POA: Diagnosis not present

## 2017-07-13 DIAGNOSIS — E1159 Type 2 diabetes mellitus with other circulatory complications: Secondary | ICD-10-CM | POA: Diagnosis not present

## 2017-07-13 DIAGNOSIS — E1129 Type 2 diabetes mellitus with other diabetic kidney complication: Secondary | ICD-10-CM | POA: Diagnosis not present

## 2017-07-19 DIAGNOSIS — I10 Essential (primary) hypertension: Secondary | ICD-10-CM | POA: Diagnosis not present

## 2017-07-19 DIAGNOSIS — E1129 Type 2 diabetes mellitus with other diabetic kidney complication: Secondary | ICD-10-CM | POA: Diagnosis not present

## 2017-07-19 DIAGNOSIS — N183 Chronic kidney disease, stage 3 (moderate): Secondary | ICD-10-CM | POA: Diagnosis not present

## 2017-07-19 DIAGNOSIS — E1159 Type 2 diabetes mellitus with other circulatory complications: Secondary | ICD-10-CM | POA: Diagnosis not present

## 2017-07-27 DIAGNOSIS — J449 Chronic obstructive pulmonary disease, unspecified: Secondary | ICD-10-CM | POA: Diagnosis not present

## 2017-07-27 DIAGNOSIS — J301 Allergic rhinitis due to pollen: Secondary | ICD-10-CM | POA: Diagnosis not present

## 2017-08-11 DIAGNOSIS — I10 Essential (primary) hypertension: Secondary | ICD-10-CM | POA: Diagnosis not present

## 2017-08-11 DIAGNOSIS — E1159 Type 2 diabetes mellitus with other circulatory complications: Secondary | ICD-10-CM | POA: Diagnosis not present

## 2017-08-11 DIAGNOSIS — E1129 Type 2 diabetes mellitus with other diabetic kidney complication: Secondary | ICD-10-CM | POA: Diagnosis not present

## 2017-08-11 DIAGNOSIS — N183 Chronic kidney disease, stage 3 (moderate): Secondary | ICD-10-CM | POA: Diagnosis not present

## 2017-09-10 DIAGNOSIS — N183 Chronic kidney disease, stage 3 (moderate): Secondary | ICD-10-CM | POA: Diagnosis not present

## 2017-09-10 DIAGNOSIS — E1159 Type 2 diabetes mellitus with other circulatory complications: Secondary | ICD-10-CM | POA: Diagnosis not present

## 2017-09-10 DIAGNOSIS — E1129 Type 2 diabetes mellitus with other diabetic kidney complication: Secondary | ICD-10-CM | POA: Diagnosis not present

## 2017-09-10 DIAGNOSIS — I10 Essential (primary) hypertension: Secondary | ICD-10-CM | POA: Diagnosis not present

## 2017-09-28 DIAGNOSIS — Z23 Encounter for immunization: Secondary | ICD-10-CM | POA: Diagnosis not present

## 2017-10-11 DIAGNOSIS — E1159 Type 2 diabetes mellitus with other circulatory complications: Secondary | ICD-10-CM | POA: Diagnosis not present

## 2017-10-11 DIAGNOSIS — I10 Essential (primary) hypertension: Secondary | ICD-10-CM | POA: Diagnosis not present

## 2017-10-11 DIAGNOSIS — E1129 Type 2 diabetes mellitus with other diabetic kidney complication: Secondary | ICD-10-CM | POA: Diagnosis not present

## 2017-10-11 DIAGNOSIS — N183 Chronic kidney disease, stage 3 (moderate): Secondary | ICD-10-CM | POA: Diagnosis not present

## 2017-10-11 DIAGNOSIS — R339 Retention of urine, unspecified: Secondary | ICD-10-CM | POA: Diagnosis not present

## 2017-10-11 DIAGNOSIS — N401 Enlarged prostate with lower urinary tract symptoms: Secondary | ICD-10-CM | POA: Diagnosis not present

## 2017-10-18 DIAGNOSIS — N183 Chronic kidney disease, stage 3 (moderate): Secondary | ICD-10-CM | POA: Diagnosis not present

## 2017-10-28 DIAGNOSIS — E559 Vitamin D deficiency, unspecified: Secondary | ICD-10-CM | POA: Diagnosis not present

## 2017-10-28 DIAGNOSIS — N189 Chronic kidney disease, unspecified: Secondary | ICD-10-CM | POA: Diagnosis not present

## 2017-10-28 DIAGNOSIS — N183 Chronic kidney disease, stage 3 (moderate): Secondary | ICD-10-CM | POA: Diagnosis not present

## 2017-10-28 DIAGNOSIS — I129 Hypertensive chronic kidney disease with stage 1 through stage 4 chronic kidney disease, or unspecified chronic kidney disease: Secondary | ICD-10-CM | POA: Diagnosis not present

## 2017-10-28 DIAGNOSIS — D631 Anemia in chronic kidney disease: Secondary | ICD-10-CM | POA: Diagnosis not present

## 2017-11-11 DIAGNOSIS — I10 Essential (primary) hypertension: Secondary | ICD-10-CM | POA: Diagnosis not present

## 2017-11-11 DIAGNOSIS — E1159 Type 2 diabetes mellitus with other circulatory complications: Secondary | ICD-10-CM | POA: Diagnosis not present

## 2017-11-11 DIAGNOSIS — E1129 Type 2 diabetes mellitus with other diabetic kidney complication: Secondary | ICD-10-CM | POA: Diagnosis not present

## 2017-11-11 DIAGNOSIS — N183 Chronic kidney disease, stage 3 (moderate): Secondary | ICD-10-CM | POA: Diagnosis not present

## 2017-11-12 DIAGNOSIS — E1129 Type 2 diabetes mellitus with other diabetic kidney complication: Secondary | ICD-10-CM | POA: Diagnosis not present

## 2017-11-12 DIAGNOSIS — E1159 Type 2 diabetes mellitus with other circulatory complications: Secondary | ICD-10-CM | POA: Diagnosis not present

## 2017-11-12 DIAGNOSIS — E1169 Type 2 diabetes mellitus with other specified complication: Secondary | ICD-10-CM | POA: Diagnosis not present

## 2017-11-19 DIAGNOSIS — E1169 Type 2 diabetes mellitus with other specified complication: Secondary | ICD-10-CM | POA: Diagnosis not present

## 2017-11-19 DIAGNOSIS — E1159 Type 2 diabetes mellitus with other circulatory complications: Secondary | ICD-10-CM | POA: Diagnosis not present

## 2017-11-19 DIAGNOSIS — E1129 Type 2 diabetes mellitus with other diabetic kidney complication: Secondary | ICD-10-CM | POA: Diagnosis not present

## 2017-11-19 DIAGNOSIS — E785 Hyperlipidemia, unspecified: Secondary | ICD-10-CM | POA: Diagnosis not present

## 2017-11-30 DIAGNOSIS — J069 Acute upper respiratory infection, unspecified: Secondary | ICD-10-CM | POA: Diagnosis not present

## 2017-11-30 DIAGNOSIS — R05 Cough: Secondary | ICD-10-CM | POA: Diagnosis not present

## 2017-12-07 DIAGNOSIS — Z0000 Encounter for general adult medical examination without abnormal findings: Secondary | ICD-10-CM | POA: Diagnosis not present

## 2017-12-07 DIAGNOSIS — Z1331 Encounter for screening for depression: Secondary | ICD-10-CM | POA: Diagnosis not present

## 2017-12-07 DIAGNOSIS — Z139 Encounter for screening, unspecified: Secondary | ICD-10-CM | POA: Diagnosis not present

## 2017-12-11 DIAGNOSIS — E1159 Type 2 diabetes mellitus with other circulatory complications: Secondary | ICD-10-CM | POA: Diagnosis not present

## 2017-12-11 DIAGNOSIS — E1169 Type 2 diabetes mellitus with other specified complication: Secondary | ICD-10-CM | POA: Diagnosis not present

## 2017-12-11 DIAGNOSIS — E785 Hyperlipidemia, unspecified: Secondary | ICD-10-CM | POA: Diagnosis not present

## 2017-12-11 DIAGNOSIS — E1129 Type 2 diabetes mellitus with other diabetic kidney complication: Secondary | ICD-10-CM | POA: Diagnosis not present

## 2017-12-16 ENCOUNTER — Encounter (HOSPITAL_COMMUNITY): Payer: Self-pay | Admitting: Emergency Medicine

## 2017-12-16 ENCOUNTER — Emergency Department (HOSPITAL_COMMUNITY): Payer: Medicare Other

## 2017-12-16 ENCOUNTER — Emergency Department (HOSPITAL_COMMUNITY)
Admission: EM | Admit: 2017-12-16 | Discharge: 2017-12-16 | Disposition: A | Discharge status: Home / Self Care | Payer: Medicare Other | Attending: Emergency Medicine | Admitting: Emergency Medicine

## 2017-12-16 DIAGNOSIS — W101XXA Fall (on)(from) sidewalk curb, initial encounter: Secondary | ICD-10-CM | POA: Diagnosis not present

## 2017-12-16 DIAGNOSIS — Z23 Encounter for immunization: Secondary | ICD-10-CM | POA: Diagnosis not present

## 2017-12-16 DIAGNOSIS — S0083XA Contusion of other part of head, initial encounter: Secondary | ICD-10-CM | POA: Insufficient documentation

## 2017-12-16 DIAGNOSIS — Y9301 Activity, walking, marching and hiking: Secondary | ICD-10-CM | POA: Insufficient documentation

## 2017-12-16 DIAGNOSIS — I129 Hypertensive chronic kidney disease with stage 1 through stage 4 chronic kidney disease, or unspecified chronic kidney disease: Secondary | ICD-10-CM | POA: Insufficient documentation

## 2017-12-16 DIAGNOSIS — E119 Type 2 diabetes mellitus without complications: Secondary | ICD-10-CM | POA: Insufficient documentation

## 2017-12-16 DIAGNOSIS — J449 Chronic obstructive pulmonary disease, unspecified: Secondary | ICD-10-CM | POA: Insufficient documentation

## 2017-12-16 DIAGNOSIS — S61412A Laceration without foreign body of left hand, initial encounter: Secondary | ICD-10-CM | POA: Diagnosis not present

## 2017-12-16 DIAGNOSIS — S62617A Displaced fracture of proximal phalanx of left little finger, initial encounter for closed fracture: Secondary | ICD-10-CM | POA: Diagnosis not present

## 2017-12-16 DIAGNOSIS — D649 Anemia, unspecified: Secondary | ICD-10-CM | POA: Insufficient documentation

## 2017-12-16 DIAGNOSIS — N183 Chronic kidney disease, stage 3 unspecified: Secondary | ICD-10-CM

## 2017-12-16 DIAGNOSIS — Y999 Unspecified external cause status: Secondary | ICD-10-CM | POA: Diagnosis not present

## 2017-12-16 DIAGNOSIS — S0003XA Contusion of scalp, initial encounter: Secondary | ICD-10-CM | POA: Diagnosis not present

## 2017-12-16 DIAGNOSIS — S62336A Displaced fracture of neck of fifth metacarpal bone, right hand, initial encounter for closed fracture: Secondary | ICD-10-CM | POA: Diagnosis not present

## 2017-12-16 DIAGNOSIS — Y929 Unspecified place or not applicable: Secondary | ICD-10-CM | POA: Diagnosis not present

## 2017-12-16 DIAGNOSIS — S62336B Displaced fracture of neck of fifth metacarpal bone, right hand, initial encounter for open fracture: Secondary | ICD-10-CM | POA: Diagnosis not present

## 2017-12-16 DIAGNOSIS — I120 Hypertensive chronic kidney disease with stage 5 chronic kidney disease or end stage renal disease: Secondary | ICD-10-CM | POA: Diagnosis not present

## 2017-12-16 DIAGNOSIS — I6523 Occlusion and stenosis of bilateral carotid arteries: Secondary | ICD-10-CM | POA: Diagnosis not present

## 2017-12-16 DIAGNOSIS — S199XXA Unspecified injury of neck, initial encounter: Secondary | ICD-10-CM | POA: Diagnosis not present

## 2017-12-16 DIAGNOSIS — S0993XA Unspecified injury of face, initial encounter: Secondary | ICD-10-CM | POA: Diagnosis not present

## 2017-12-16 DIAGNOSIS — S6991XA Unspecified injury of right wrist, hand and finger(s), initial encounter: Secondary | ICD-10-CM | POA: Diagnosis present

## 2017-12-16 HISTORY — DX: Type 2 diabetes mellitus without complications: E11.9

## 2017-12-16 HISTORY — DX: Unilateral inguinal hernia, without obstruction or gangrene, not specified as recurrent: K40.90

## 2017-12-16 HISTORY — DX: Chronic obstructive pulmonary disease, unspecified: J44.9

## 2017-12-16 HISTORY — DX: Essential (primary) hypertension: I10

## 2017-12-16 LAB — CBC WITH DIFFERENTIAL/PLATELET
Abs Immature Granulocytes: 0.05 10*3/uL (ref 0.00–0.07)
Basophils Absolute: 0 10*3/uL (ref 0.0–0.1)
Basophils Relative: 0 %
EOS ABS: 0.1 10*3/uL (ref 0.0–0.5)
Eosinophils Relative: 1 %
HEMATOCRIT: 29.5 % — AB (ref 39.0–52.0)
Hemoglobin: 9.2 g/dL — ABNORMAL LOW (ref 13.0–17.0)
Immature Granulocytes: 1 %
Lymphocytes Relative: 12 %
Lymphs Abs: 1 10*3/uL (ref 0.7–4.0)
MCH: 28.3 pg (ref 26.0–34.0)
MCHC: 31.2 g/dL (ref 30.0–36.0)
MCV: 90.8 fL (ref 80.0–100.0)
Monocytes Absolute: 0.8 10*3/uL (ref 0.1–1.0)
Monocytes Relative: 10 %
Neutro Abs: 6.8 10*3/uL (ref 1.7–7.7)
Neutrophils Relative %: 76 %
Platelets: 207 10*3/uL (ref 150–400)
RBC: 3.25 MIL/uL — ABNORMAL LOW (ref 4.22–5.81)
RDW: 14.1 % (ref 11.5–15.5)
WBC: 8.8 10*3/uL (ref 4.0–10.5)
nRBC: 0 % (ref 0.0–0.2)

## 2017-12-16 LAB — BASIC METABOLIC PANEL
Anion gap: 7 (ref 5–15)
BUN: 31 mg/dL — ABNORMAL HIGH (ref 8–23)
CO2: 22 mmol/L (ref 22–32)
Calcium: 8.7 mg/dL — ABNORMAL LOW (ref 8.9–10.3)
Chloride: 108 mmol/L (ref 98–111)
Creatinine, Ser: 1.62 mg/dL — ABNORMAL HIGH (ref 0.61–1.24)
GFR calc Af Amer: 47 mL/min — ABNORMAL LOW (ref >60)
GFR calc non Af Amer: 40 mL/min — ABNORMAL LOW (ref >60)
Glucose, Bld: 142 mg/dL — ABNORMAL HIGH (ref 70–99)
Potassium: 5 mmol/L (ref 3.5–5.1)
Sodium: 137 mmol/L (ref 135–145)

## 2017-12-16 MED ORDER — CEPHALEXIN 500 MG PO CAPS: 500.0000 mg | ORAL_CAPSULE | Freq: Three times a day (TID) | ORAL | 0 refills | Status: AC

## 2017-12-16 MED ORDER — LIDOCAINE-EPINEPHRINE-TETRACAINE (LET) SOLUTION
3.0000 mL | Freq: Once | NASAL | Status: AC
  Administered 2017-12-16: 3 mL via TOPICAL
  Filled 2017-12-16: qty 3

## 2017-12-16 MED ORDER — TETANUS-DIPHTH-ACELL PERTUSSIS 5-2.5-18.5 LF-MCG/0.5 IM SUSP
0.5000 mL | Freq: Once | INTRAMUSCULAR | Status: AC
  Administered 2017-12-16: 0.5 mL via INTRAMUSCULAR
  Filled 2017-12-16: qty 0.5

## 2017-12-16 MED ORDER — HYDROCODONE-ACETAMINOPHEN 5-325 MG PO TABS: 1.0000 | ORAL_TABLET | Freq: Four times a day (QID) | ORAL | 0 refills | Status: DC | PRN

## 2017-12-16 MED ORDER — HYDROCODONE-ACETAMINOPHEN 5-325 MG PO TABS
1.0000 | ORAL_TABLET | Freq: Once | ORAL | Status: AC
  Administered 2017-12-16: 1 via ORAL
  Filled 2017-12-16: qty 1

## 2017-12-16 MED ORDER — CEFAZOLIN SODIUM-DEXTROSE 1-4 GM/50ML-% IV SOLN
1.0000 g | Freq: Once | INTRAVENOUS | Status: AC
  Administered 2017-12-16: 1 g via INTRAVENOUS
  Filled 2017-12-16: qty 50

## 2017-12-16 MED ORDER — LIDOCAINE HCL 2 % IJ SOLN
15.0000 mL | Freq: Once | INTRAMUSCULAR | Status: AC
  Administered 2017-12-16: 300 mg
  Filled 2017-12-16: qty 20

## 2017-12-16 NOTE — ED Notes (Signed)
ED Provider at bedside. 

## 2017-12-16 NOTE — ED Notes (Signed)
Ortho at bedside.

## 2017-12-16 NOTE — Discharge Instructions (Addendum)
Please see the information and instructions below regarding your visit.  Your diagnoses today include:  1. Open displaced fracture of neck of fifth metacarpal bone of right hand, initial encounter   2. Atherosclerosis of both carotid arteries   3. Normocytic anemia   4. CKD (chronic kidney disease) stage 3, GFR 30-59 ml/min (HCC)     Tests performed today include: See side panel of your discharge paperwork for testing performed today. Vital signs are listed at the bottom of these instructions.   Medications prescribed:    Take any prescribed medications only as prescribed, and any over the counter medications only as directed on the packaging.  You have been prescribed Norco for pain. This is an opioid pain medication. You may take this medication every 4-6 hours as needed for pain. Only take this medication if you need it for breakthrough pain.   Do not combine this medication with Tylenol, as it may increase the risk of liver problems.  Do not combine this medication with alcohol.  Please be advised to avoid driving or operating heavy machinery while taking this medication, as it may make you drowsy or impair judgment.   Please take all of your antibiotics until finished.   You may develop abdominal discomfort or nausea from the antibiotic. If this occurs, you may take it with food. Some patients also get diarrhea with antibiotics. You may help offset this with probiotics which you can buy or get in yogurt. Do not eat or take the probiotics until 2 hours after your antibiotic. Some women develop vaginal yeast infections after antibiotics. If you develop unusual vaginal discharge after being on this medication, please see your primary care provider.   Some people develop allergies to antibiotics. Symptoms of antibiotic allergy can be mild and include a flat rash and itching. They can also be more serious and include:  ?Hives - Hives are raised, red patches of skin that are usually very  itchy.  ?Lip or tongue swelling  ?Trouble swallowing or breathing  ?Blistering of the skin or mouth.  If you have any of these serious symptoms, please seek emergency medical care immediately.   Home care instructions:  Please follow any educational materials contained in this packet.   Please keep the splint in place until you see Dr. Fredna Dow next week.  Please keep it dry.  Cover with plastic for bathing.  Follow-up instructions:  Please follow up with Dr. Fredna Dow by calling tomorrow to make an appointment for early next week.  Return instructions:  Please return to the Emergency Department if you experience worsening symptoms.  Please return the emergency department if you develop any worsening pain, discoloration of your fingers, inability to feel the fingers.  Please first loosen the splint to the should occur.  Then follow-up with the emergency department. Please return if you have any other emergent concerns.  Additional Information:   Your vital signs today were: BP (!) 151/58 (BP Location: Right Arm)    Pulse 79    Temp 99 F (37.2 C) (Oral)    Resp 16    SpO2 100%  If your blood pressure (BP) was elevated on multiple readings during this visit above 130 for the top number or above 80 for the bottom number, please have this repeated by your primary care provider within one month. --------------  Thank you for allowing [place] to participate in your care today.

## 2017-12-16 NOTE — ED Provider Notes (Addendum)
[facility] EMERGENCY DEPARTMENT Provider Note   CSN: [id number] Arrival date & time: 12/16/17  1430     History   Chief Complaint Chief Complaint  Patient presents with  . Fall    HPI Nil Terry Vasquez is a 77 y.o. male presented today after fall that occurred around 1 PM.  Patient states that he was walking around his car when he stepped off a curb falling onto his left hand and striking the left side of his face on the ground.  Patient denies loss of consciousness.  Patient endorses immediate pain to the left hand, severe, throbbing, constant and worse with movement.  Patient states that he had a laceration present to his left palm and stop bleeding with direct pressure.  Patient does not take anything for his pain prior to arrival.  Patient states that his pain is now mild in intensity.  Recently patient endorses a mild throbbing pain to his left eyebrow, constant worsened with palpation.  Hematoma present to this area.  Patient presented to an urgent care in [place] where x-rays were performed noting fracture.  Consult was then called to Dr. Fredna Dow, hand who advised that the patient sent to the emergency department for further evaluation.  Per patient and family Dr. Gaynelle Arabian to see patient here in the emergency department.  Patient states that he is otherwise feeling very well, denies headache, loss consciousness, nausea/vomiting, extremity numbness/tingling or weakness, neck pain, back pain, chest pain or abdominal pain.  Patient states that he does take aspirin daily.  HPI  Past Medical History:  Diagnosis Date  . COPD (chronic obstructive pulmonary disease) ([place])   . Diabetes mellitus without complication ([place])   . Hypertension   . Inguinal hernia     There are no active problems to display for this patient.   Past Surgical History:  Procedure Laterality Date  . APPENDECTOMY    . INGUINAL HERNIA REPAIR    . SHOULDER SURGERY Right         Home  Medications    Prior to Admission medications   Medication Sig Start Date End Date Taking? Authorizing Provider  cephALEXin (KEFLEX) 500 MG capsule Take 1 capsule (500 mg total) by mouth 3 (three) times daily for 5 days. 12/16/17 12/21/17  Langston Masker B, PA-C  HYDROcodone-acetaminophen (NORCO/VICODIN) 5-325 MG tablet Take 1 tablet by mouth every 6 (six) hours as needed. 12/16/17   Langston Masker B, PA-C  HYDROcodone-acetaminophen (NORCO/VICODIN) 5-325 MG tablet Take 1 tablet by mouth every 6 (six) hours as needed. 12/16/17   Albesa Seen, PA-C    Family History No family history on file.  Social History Social History   Tobacco Use  . Smoking status: Never Smoker  . Smokeless tobacco: Never Used  Substance Use Topics  . Alcohol use: Never    Frequency: Never  . Drug use: Never     Allergies   Actos [pioglitazone]; Glipizide; and Ibuprofen   Review of Systems Review of Systems  Constitutional: Negative.  Negative for chills and fever.  HENT: Negative.  Negative for rhinorrhea and sore throat.   Eyes: Negative.  Negative for visual disturbance.  Respiratory: Negative.  Negative for cough and shortness of breath.   Cardiovascular: Negative.  Negative for chest pain.  Gastrointestinal: Negative.  Negative for abdominal pain, diarrhea, nausea and vomiting.  Genitourinary: Negative.  Negative for dysuria and hematuria.  Musculoskeletal: Positive for arthralgias (Left hand). Negative for myalgias.  Skin: Positive for wound (Left hand). Negative  for rash.  Neurological: Negative.  Negative for dizziness, syncope, weakness, numbness and headaches.    Physical Exam Updated Vital Signs BP (!) 140/58 (BP Location: Right Arm)   Pulse 60   Temp 99 F (37.2 C) (Oral)   Resp 16   SpO2 96%   Physical Exam  Constitutional: He is oriented to person, place, and time. He appears well-developed and well-nourished. No distress.  HENT:  Head: Normocephalic and atraumatic. Head is  without raccoon's eyes and without Battle's sign.  Right Ear: Hearing, tympanic membrane, external ear and ear canal normal. No hemotympanum.  Left Ear: Hearing, tympanic membrane, external ear and ear canal normal. No hemotympanum.  Nose: Nose normal.  Mouth/Throat: Uvula is midline, oropharynx is clear and moist and mucous membranes are normal.  Eyes: Pupils are equal, round, and reactive to light. Conjunctivae and EOM are normal.  Neck: Trachea normal, normal range of motion, full passive range of motion without pain and phonation normal. Neck supple. No tracheal tenderness, no spinous process tenderness and no muscular tenderness present. No tracheal deviation present.  Cardiovascular: Normal rate, regular rhythm and normal heart sounds.  Pulses:      Radial pulses are 2+ on the right side, and 2+ on the left side.       Dorsalis pedis pulses are 2+ on the right side, and 2+ on the left side.       Posterior tibial pulses are 2+ on the right side, and 2+ on the left side.  Pulmonary/Chest: Effort normal and breath sounds normal. No respiratory distress. He has no decreased breath sounds. He exhibits no tenderness, no crepitus and no deformity.  Abdominal: Soft. There is no tenderness. There is no rigidity, no rebound and no guarding.  Musculoskeletal: Normal range of motion.  No midline C/T/L spinal tenderness to palpation, no paraspinal muscle tenderness, no deformity, crepitus, or step-off noted. No sign of injury to the neck or back.  Patient with small abrasions present to bilateral knees, full range of motion and 5/5 strength to flexion and extension at the knee, no patellar deformity, no pain with palpation.  Left hand: Large laceration to left palm as seen below. There is to palpation over laceration and left fifth MCP.  Patient endorses tingling to left fifth finger.  Capillary refill intact.  All other fingers with sensation, capillary refill and motion intact.   Feet:  Right  Foot:  Protective Sensation: 3 sites tested. 3 sites sensed.  Left Foot:  Protective Sensation: 3 sites tested. 3 sites sensed.  Neurological: He is alert and oriented to person, place, and time. GCS eye subscore is 4. GCS verbal subscore is 5. GCS motor subscore is 6.  Mental Status: Alert, oriented, thought content appropriate, able to give a coherent history. Speech fluent without evidence of aphasia. Able to follow 2 step commands without difficulty. Cranial Nerves: II: Peripheral visual fields grossly normal, pupils equal, round, reactive to light III,IV, VI: ptosis not present, extra-ocular motions intact bilaterally V,VII: smile symmetric, eyebrows raise symmetric, facial light touch sensation equal VIII: hearing grossly normal to voice X: uvula elevates symmetrically XI: bilateral shoulder shrug symmetric and strong XII: midline tongue extension without fassiculations Motor: Normal tone. 5/5 strength in upper and lower extremities bilaterally including strong and equal grip strength and dorsiflexion/plantar flexion Sensory: Sensation intact to light touch in all extremities.Negative Romberg.  Cerebellar: normal finger-to-nose with bilateral upper extremities. Normal heel-to -shin balance bilaterally of the lower extremity. No pronator drift.  Gait: normal  gait and balance CV: distal pulses palpable throughout  Skin: Skin is warm and dry.  Psychiatric: He has a normal mood and affect. His behavior is normal.      ED Treatments / Results  Labs (all labs ordered are listed, but only abnormal results are displayed) Labs Reviewed  CBC WITH DIFFERENTIAL/PLATELET - Abnormal; Notable for the following components:      Result Value   RBC 3.25 (*)    Hemoglobin 9.2 (*)    HCT 29.5 (*)    All other components within normal limits  BASIC METABOLIC PANEL - Abnormal; Notable for the following components:   Glucose, Bld 142 (*)    BUN 31 (*)    Creatinine, Ser 1.62 (*)     Calcium 8.7 (*)    GFR calc non Af Amer 40 (*)    GFR calc Af Amer 47 (*)    All other components within normal limits    EKG None  Radiology Ct Head Wo Contrast  Result Date: 12/16/2017 CLINICAL DATA:  [address] and hit his head on a sidewalk curb this morning. Associated abrasion and hematoma. The patient takes 81 mg of aspirin daily. EXAM: CT HEAD WITHOUT CONTRAST CT MAXILLOFACIAL WITHOUT CONTRAST CT CERVICAL SPINE WITHOUT CONTRAST TECHNIQUE: Multidetector CT imaging of the head, cervical spine, and maxillofacial structures were performed using the standard protocol without intravenous contrast. Multiplanar CT image reconstructions of the cervical spine and maxillofacial structures were also generated. COMPARISON:  None. FINDINGS: CT HEAD FINDINGS Brain: Mildly enlarged ventricles and cortical sulci. Minimal patchy white matter low density in both cerebral hemispheres. No intracranial hemorrhage, mass lesion or CT evidence of acute infarction. Vascular: No hyperdense vessel or unexpected calcification. Skull: Normal. Negative for fracture or focal lesion. Other: None. CT MAXILLOFACIAL FINDINGS Osseous: No fracture or mandibular dislocation. No destructive process. Orbits: Status post bilateral cataract extraction. Otherwise, normal appearing orbital contents. Sinuses: Clear. Soft tissues: Mild left lateral periorbital soft tissue swelling. CT CERVICAL SPINE FINDINGS Alignment: Mild retrolisthesis at the C3-4 C4-5 levels with degenerative changes at those levels. Skull base and vertebrae: No acute fracture. No primary bone lesion or focal pathologic process. Soft tissues and spinal canal: No prevertebral fluid or swelling. No visible canal hematoma. Disc levels: Degenerative changes at the C2-3 through C5-6 levels as well as mild fragmented anterior spur formation at the C6-7 level. Upper chest: Biapical pleural and parenchymal scarring. Other: Bilateral dense carotid artery calcifications. IMPRESSION: 1.  No skull fracture or intracranial hemorrhage. 2. No maxillofacial fracture. 3. No cervical spine fracture or traumatic subluxation. 4. Mild diffuse cerebral and cerebellar atrophy. 5. Minimal chronic small vessel white matter ischemic changes in both cerebral hemispheres. 6. Cervical spine degenerative changes. 7. Bilateral dense carotid artery atheromatous calcifications. Electronically Signed   By: Claudie Revering M.D.   On: 12/16/2017 17:27   Ct Cervical Spine Wo Contrast  Result Date: 12/16/2017 CLINICAL DATA:  [address] and hit his head on a sidewalk curb this morning. Associated abrasion and hematoma. The patient takes 81 mg of aspirin daily. EXAM: CT HEAD WITHOUT CONTRAST CT MAXILLOFACIAL WITHOUT CONTRAST CT CERVICAL SPINE WITHOUT CONTRAST TECHNIQUE: Multidetector CT imaging of the head, cervical spine, and maxillofacial structures were performed using the standard protocol without intravenous contrast. Multiplanar CT image reconstructions of the cervical spine and maxillofacial structures were also generated. COMPARISON:  None. FINDINGS: CT HEAD FINDINGS Brain: Mildly enlarged ventricles and cortical sulci. Minimal patchy white matter low density in both cerebral hemispheres. No intracranial hemorrhage, mass  lesion or CT evidence of acute infarction. Vascular: No hyperdense vessel or unexpected calcification. Skull: Normal. Negative for fracture or focal lesion. Other: None. CT MAXILLOFACIAL FINDINGS Osseous: No fracture or mandibular dislocation. No destructive process. Orbits: Status post bilateral cataract extraction. Otherwise, normal appearing orbital contents. Sinuses: Clear. Soft tissues: Mild left lateral periorbital soft tissue swelling. CT CERVICAL SPINE FINDINGS Alignment: Mild retrolisthesis at the C3-4 C4-5 levels with degenerative changes at those levels. Skull base and vertebrae: No acute fracture. No primary bone lesion or focal pathologic process. Soft tissues and spinal canal: No prevertebral  fluid or swelling. No visible canal hematoma. Disc levels: Degenerative changes at the C2-3 through C5-6 levels as well as mild fragmented anterior spur formation at the C6-7 level. Upper chest: Biapical pleural and parenchymal scarring. Other: Bilateral dense carotid artery calcifications. IMPRESSION: 1. No skull fracture or intracranial hemorrhage. 2. No maxillofacial fracture. 3. No cervical spine fracture or traumatic subluxation. 4. Mild diffuse cerebral and cerebellar atrophy. 5. Minimal chronic small vessel white matter ischemic changes in both cerebral hemispheres. 6. Cervical spine degenerative changes. 7. Bilateral dense carotid artery atheromatous calcifications. Electronically Signed   By: Claudie Revering M.D.   On: 12/16/2017 17:27   Dg Hand Complete Left  Result Date: 12/16/2017 CLINICAL DATA:  Left hand pain following a fall this morning. EXAM: LEFT HAND - COMPLETE 3+ VIEW COMPARISON:  None. FINDINGS: Mildly impacted fracture of the base of the 5th proximal phalanx with mild ulnar and dorsal angulation of the distal fragment without significant displacement. No intra-articular extension seen. Essentially nondisplaced transverse fracture of the 5th metacarpal neck with mild ventral/radial angulation of the distal fragment. No intra-articular extension seen. Mild 1st metacarpal/carpal degenerative changes. IMPRESSION: Fractures of the 5th metacarpal neck and 5th proximal phalanx, as described above. Electronically Signed   By: Claudie Revering M.D.   On: 12/16/2017 15:43   Ct Maxillofacial Wo Contrast  Result Date: 12/16/2017 CLINICAL DATA:  [place] and hit his head on a sidewalk curb this morning. Associated abrasion and hematoma. The patient takes 81 mg of aspirin daily. EXAM: CT HEAD WITHOUT CONTRAST CT MAXILLOFACIAL WITHOUT CONTRAST CT CERVICAL SPINE WITHOUT CONTRAST TECHNIQUE: Multidetector CT imaging of the head, cervical spine, and maxillofacial structures were performed using the standard protocol  without intravenous contrast. Multiplanar CT image reconstructions of the cervical spine and maxillofacial structures were also generated. COMPARISON:  None. FINDINGS: CT HEAD FINDINGS Brain: Mildly enlarged ventricles and cortical sulci. Minimal patchy white matter low density in both cerebral hemispheres. No intracranial hemorrhage, mass lesion or CT evidence of acute infarction. Vascular: No hyperdense vessel or unexpected calcification. Skull: Normal. Negative for fracture or focal lesion. Other: None. CT MAXILLOFACIAL FINDINGS Osseous: No fracture or mandibular dislocation. No destructive process. Orbits: Status post bilateral cataract extraction. Otherwise, normal appearing orbital contents. Sinuses: Clear. Soft tissues: Mild left lateral periorbital soft tissue swelling. CT CERVICAL SPINE FINDINGS Alignment: Mild retrolisthesis at the C3-4 C4-5 levels with degenerative changes at those levels. Skull base and vertebrae: No acute fracture. No primary bone lesion or focal pathologic process. Soft tissues and spinal canal: No prevertebral fluid or swelling. No visible canal hematoma. Disc levels: Degenerative changes at the C2-3 through C5-6 levels as well as mild fragmented anterior spur formation at the C6-7 level. Upper chest: Biapical pleural and parenchymal scarring. Other: Bilateral dense carotid artery calcifications. IMPRESSION: 1. No skull fracture or intracranial hemorrhage. 2. No maxillofacial fracture. 3. No cervical spine fracture or traumatic subluxation. 4. Mild diffuse cerebral and  cerebellar atrophy. 5. Minimal chronic small vessel white matter ischemic changes in both cerebral hemispheres. 6. Cervical spine degenerative changes. 7. Bilateral dense carotid artery atheromatous calcifications. Electronically Signed   By: Claudie Revering M.D.   On: 12/16/2017 17:27    Procedures Procedures (including critical care time)  Medications Ordered in ED Medications  Tdap (BOOSTRIX) injection 0.5 mL  (0.5 mLs Intramuscular Given 12/16/17 1710)  ceFAZolin (ANCEF) IVPB 1 g/50 mL premix (0 g Intravenous Stopped 12/16/17 1740)  lidocaine (XYLOCAINE) 2 % (with pres) injection 300 mg (300 mg Infiltration Given 12/16/17 1822)  lidocaine-EPINEPHrine-tetracaine (LET) solution (3 mLs Topical Given 12/16/17 1823)  HYDROcodone-acetaminophen (NORCO/VICODIN) 5-325 MG per tablet 1 tablet (1 tablet Oral Given 12/16/17 1857)     Initial Impression / Assessment and Plan / ED Course  I have reviewed the triage vital signs and the nursing notes.  Pertinent labs & imaging results that were available during my care of the patient were reviewed by me and considered in my medical decision making (see chart for details).  Clinical Course as of Dec 18 1302  Thu Dec 16, 2017  1801 Unknown chronicity.   Hemoglobin(!): 9.2 [AM]  6761 Spoke with Dr. Fredna Dow. Does not feel that pt has open fracture. Recommends closing primarily in Ed, xeroform dressing, and follow up early next week with placement in ulnar gutter. Appreciate his involvement.    [AM]    Clinical Course User Index [AM] Albesa Seen, PA-C   77 year old male presenting after fall that occurred at 1 PM today.  Mechanical fall while stepping off a curb.  Imaging of left hand today shows fracture of the fifth metacarpal neck and fifth proximal phalanx.  There is a large laceration to the left palm in proximity to the fracture.  Patient states last tetanus shot was greater than 8 years ago.  Tdap given today.  Patient started on Ancef.  CBC and BMP pending.  Additionally patient with hematoma to left lateral eyebrow.  No loss of consciousness.  Normal neuro exam.  No pain with extraocular motion.  CT head/maxface and cervical spine pending at this time.  Patient without pain in the neck, denies pain with range of motion.  No signs of injury to the neck, back, chest or abdomen.  Patient with small abrasions to bilateral knees, neurovascular intact bilateral lower  extremities full range of motion 5/5 strength with movements. Ambulatory without difficulty.  Vital Signs Stable -------------------------------------------------------------------------------------- Care handoff given to Langston Masker, PA-C at shift change, plan at this time is to await CT head/cervical spine/maxillofacial.  Consult Dr. Fredna Dow hand who is aware of patient for further treatment of open fracture. Re-evaluation and appropriately disposition.  Patient's case discussed with Dr. Wilson Singer who agrees with plan above at this time.  Note: Portions of this report may have been transcribed using voice recognition software. Every effort was made to ensure accuracy; however, inadvertent computerized transcription errors may still be present. Final Clinical Impressions(s) / ED Diagnoses   Final diagnoses:  Open displaced fracture of neck of fifth metacarpal bone of right hand, initial encounter  Atherosclerosis of both carotid arteries  Normocytic anemia  CKD (chronic kidney disease) stage 3, GFR 30-59 ml/min [facility])    ED Discharge Orders         Ordered    HYDROcodone-acetaminophen (NORCO/VICODIN) 5-325 MG tablet  Every 6 hours PRN     12/16/17 2211    HYDROcodone-acetaminophen (NORCO/VICODIN) 5-325 MG tablet  Every 6 hours PRN  12/16/17 2211    cephALEXin (KEFLEX) 500 MG capsule  3 times daily     12/16/17 2211           Gari Crown 12/16/17 1621    Gari Crown 12/17/17 1304    Virgel Manifold, MD 12/17/17 1536

## 2017-12-16 NOTE — Progress Notes (Signed)
Orthopedic Tech Progress Note Patient Details:  Terry Vasquez [birth year] [id number]  Ortho Devices Type of Ortho Device: Ulna gutter splint Ortho Device/Splint Interventions: Application, Ordered   Post Interventions Patient Tolerated: Well Instructions Provided: Care of device   Melony Overly T 12/16/2017, 9:55 PM

## 2017-12-16 NOTE — ED Notes (Signed)
Ortho paged. 

## 2017-12-16 NOTE — ED Notes (Signed)
Patient transported to CT 

## 2017-12-16 NOTE — ED Triage Notes (Signed)
Patient to ED from Urgent Care in [place] to see Dr. Fredna Dow for 2 broken fingers on the L hand - had mechanical fall this morning on sidewalk curb. Hit side of head - abrasion and hematoma noted. Takes 81mg  ASA daily, no other blood thinners.

## 2017-12-16 NOTE — ED Provider Notes (Signed)
Physical Exam  BP (!) 151/58 (BP Location: Right Arm)   Pulse 79   Temp 99 F (37.2 C) (Oral)   Resp 16   SpO2 100%   Assumed care from [facility], PA-C at 1600. Briefly, the patient is a 77 y.o. male with PMHx of  has a past medical history of COPD (chronic obstructive pulmonary disease) ([place]), Diabetes mellitus without complication ([place]), Hypertension, and Inguinal hernia. here with fall resulting in head and right hand trauma.  Patient was initially evaluated by urgent care, found to have left fifth metacarpal neck and proximal phalanx of the left small finger fracture.  Dr. Fredna Dow was consulted at the urgent care, and he recommended transfer the emergency department for further evaluation.  This was a mechanical fall.  No precipitant syncopal episode.  Patient remaining neurologically intact and nontoxic during ED course.   Labs Reviewed  CBC WITH DIFFERENTIAL/PLATELET - Abnormal; Notable for the following components:      Result Value   RBC 3.25 (*)    Hemoglobin 9.2 (*)    HCT 29.5 (*)    All other components within normal limits  BASIC METABOLIC PANEL    Course of Care:   Physical Exam  Constitutional: He appears well-developed and well-nourished. No distress.  Sitting comfortably in bed.  HENT:  Head: Normocephalic and atraumatic.  Eyes: Conjunctivae are normal. Right eye exhibits no discharge. Left eye exhibits no discharge.  EOMs normal to gross examination.  Neck: Normal range of motion.  Cardiovascular: Normal rate and regular rhythm.  Intact, 2+ radial pulse of LUE.  Pulmonary/Chest:  Normal respiratory effort. Patient converses comfortably. No audible wheeze or stridor.  Abdominal: He exhibits no distension.  Musculoskeletal:  Patient demonstrates ecchymosis overlying the fifth metacarpal and proximal phalanx of the fifth digit of the LUE.  Rotational deformity is noted.  Capillary refill less than 2 seconds distally in all fingers of the left hand.  Patient  has full distal sensation to light touch in all fingers of the left upper extremity.  Neurological: He is alert.  Cranial nerves intact to gross observation. Patient moves extremities without difficulty.  Skin: Skin is warm and dry. He is not diaphoretic.  Psychiatric: He has a normal mood and affect. His behavior is normal. Judgment and thought content normal.  Nursing note and vitals reviewed.   ED Course/Procedures   Clinical Course as of Dec 16 2200  Thu Dec 16, 2017  1801 Unknown chronicity.   Hemoglobin(!): 9.2 [AM]  1610 Spoke with Dr. Fredna Dow. Does not feel that pt has open fracture. Recommends closing primarily in Ed, xeroform dressing, and follow up early next week with placement in ulnar gutter. Appreciate his involvement.    [AM]    Clinical Course User Index [AM] Albesa Seen, PA-C    .Marland KitchenLaceration Repair Date/Time: 12/16/2017 10:03 PM Performed by: Albesa Seen, PA-C Authorized by: Albesa Seen, PA-C   Consent:    Consent obtained:  Verbal   Consent given by:  Patient   Risks discussed:  Infection and pain Anesthesia (see MAR for exact dosages):    Anesthesia method:  Local infiltration and topical application   Topical anesthetic:  LET   Local anesthetic:  Lidocaine 2% w/o epi Laceration details:    Location:  Hand   Hand location:  L palm   Length (cm):  4 Repair type:    Repair type:  Simple Pre-procedure details:    Preparation:  Patient was prepped and draped in  usual sterile fashion Exploration:    Hemostasis achieved with:  Direct pressure   Wound exploration: wound explored through full range of motion     Wound extent: no fascia violation noted, no nerve damage noted and no tendon damage noted     Contaminated: no   Treatment:    Area cleansed with:  Betadine and saline   Amount of cleaning:  Extensive   Irrigation solution:  Sterile saline   Irrigation volume:  1000 ml   Irrigation method:  Pressure wash Skin repair:    Repair  method:  Sutures   Suture size:  4-0   Suture material:  Nylon   Suture technique:  Simple interrupted   Number of sutures:  6 Approximation:    Approximation:  Close Post-procedure details:    Dressing:  Non-adherent dressing (Xeroform and nonadherent dressing.)   Patient tolerance of procedure:  Tolerated well, no immediate complications    MDM   Patient well-appearing neurovascularly intact in the left upper extremity.  Case was discussed with Dr. Fredna Dow of hand surgery, who feels that based on examination, images, and x-ray, open fracture unlikely.  Patient was given Ancef prophylactically.  Tdap was updated.  Laceration was anesthetized and probed extensively.  There appears to be no damage deeper structures.  Closed primarily.  Patient placed in ulnar gutter splint.  Examination post splint application demonstrates capillary refill less than 2 seconds in all fingers of the left upper extremity.  Patient given follow-up with Dr. Fredna Dow and instructions to follow-up within 5 days.  Patient prescribed Norco for pain.  Patient is counseled not to drive, drink alcohol, or operate machinery while taking this medication.  Due to patient's home pharmacy being closed, patient was prescribed Norco, #2 to fill at 24-hour pharmacy, and additional prescription for Norco # 10.  Patient also given Keflex prescription, renally dose, after discussion with pharmacy, for wound infection prophylaxis.  I have reviewed the patient's information in the [facility] for the past 12 months and found them to have no overlapping Rx.  Opiates were prescribed for an acute, painful condition. The patient was given information on side effects and encouraged to use other, non-opiate pain medication primary, only using opiate medicine sparingly for severe pain.  This is a shared visit with Dr. Virgel Manifold. Patient was independently evaluated by this attending physician. Attending  physician consulted in evaluation and discharge management.       Albesa Seen, PA-C 12/17/17 0038    Virgel Manifold, MD 12/17/17 [phone]

## 2017-12-20 DIAGNOSIS — I6523 Occlusion and stenosis of bilateral carotid arteries: Secondary | ICD-10-CM | POA: Diagnosis not present

## 2017-12-20 DIAGNOSIS — I672 Cerebral atherosclerosis: Secondary | ICD-10-CM | POA: Diagnosis not present

## 2017-12-20 DIAGNOSIS — G319 Degenerative disease of nervous system, unspecified: Secondary | ICD-10-CM | POA: Diagnosis not present

## 2017-12-20 DIAGNOSIS — D649 Anemia, unspecified: Secondary | ICD-10-CM | POA: Diagnosis not present

## 2017-12-21 DIAGNOSIS — S62367A Nondisplaced fracture of neck of fifth metacarpal bone, left hand, initial encounter for closed fracture: Secondary | ICD-10-CM | POA: Diagnosis not present

## 2017-12-21 DIAGNOSIS — S62615A Displaced fracture of proximal phalanx of left ring finger, initial encounter for closed fracture: Secondary | ICD-10-CM | POA: Diagnosis not present

## 2017-12-21 DIAGNOSIS — S62617A Displaced fracture of proximal phalanx of left little finger, initial encounter for closed fracture: Secondary | ICD-10-CM | POA: Diagnosis not present

## 2017-12-21 DIAGNOSIS — S61412A Laceration without foreign body of left hand, initial encounter: Secondary | ICD-10-CM | POA: Diagnosis not present

## 2017-12-27 DIAGNOSIS — I6523 Occlusion and stenosis of bilateral carotid arteries: Secondary | ICD-10-CM | POA: Diagnosis not present

## 2017-12-29 DIAGNOSIS — S62367A Nondisplaced fracture of neck of fifth metacarpal bone, left hand, initial encounter for closed fracture: Secondary | ICD-10-CM | POA: Diagnosis not present

## 2017-12-29 DIAGNOSIS — S62617A Displaced fracture of proximal phalanx of left little finger, initial encounter for closed fracture: Secondary | ICD-10-CM | POA: Diagnosis not present

## 2017-12-29 DIAGNOSIS — S62615A Displaced fracture of proximal phalanx of left ring finger, initial encounter for closed fracture: Secondary | ICD-10-CM | POA: Diagnosis not present

## 2017-12-31 DIAGNOSIS — I6522 Occlusion and stenosis of left carotid artery: Secondary | ICD-10-CM | POA: Diagnosis not present

## 2017-12-31 DIAGNOSIS — Z6825 Body mass index (BMI) 25.0-25.9, adult: Secondary | ICD-10-CM | POA: Diagnosis not present

## 2017-12-31 DIAGNOSIS — E538 Deficiency of other specified B group vitamins: Secondary | ICD-10-CM | POA: Diagnosis not present

## 2018-01-09 DIAGNOSIS — J069 Acute upper respiratory infection, unspecified: Secondary | ICD-10-CM | POA: Diagnosis not present

## 2018-01-09 DIAGNOSIS — J111 Influenza due to unidentified influenza virus with other respiratory manifestations: Secondary | ICD-10-CM | POA: Diagnosis not present

## 2018-01-11 DIAGNOSIS — I672 Cerebral atherosclerosis: Secondary | ICD-10-CM | POA: Diagnosis not present

## 2018-01-11 DIAGNOSIS — S62615A Displaced fracture of proximal phalanx of left ring finger, initial encounter for closed fracture: Secondary | ICD-10-CM | POA: Diagnosis not present

## 2018-01-11 DIAGNOSIS — S62367A Nondisplaced fracture of neck of fifth metacarpal bone, left hand, initial encounter for closed fracture: Secondary | ICD-10-CM | POA: Diagnosis not present

## 2018-01-11 DIAGNOSIS — E1129 Type 2 diabetes mellitus with other diabetic kidney complication: Secondary | ICD-10-CM | POA: Diagnosis not present

## 2018-01-11 DIAGNOSIS — I6522 Occlusion and stenosis of left carotid artery: Secondary | ICD-10-CM | POA: Diagnosis not present

## 2018-01-11 DIAGNOSIS — S62617A Displaced fracture of proximal phalanx of left little finger, initial encounter for closed fracture: Secondary | ICD-10-CM | POA: Diagnosis not present

## 2018-01-11 DIAGNOSIS — G319 Degenerative disease of nervous system, unspecified: Secondary | ICD-10-CM | POA: Diagnosis not present

## 2018-01-18 DIAGNOSIS — J301 Allergic rhinitis due to pollen: Secondary | ICD-10-CM | POA: Diagnosis not present

## 2018-01-18 DIAGNOSIS — J449 Chronic obstructive pulmonary disease, unspecified: Secondary | ICD-10-CM | POA: Diagnosis not present

## 2018-01-18 DIAGNOSIS — R05 Cough: Secondary | ICD-10-CM | POA: Diagnosis not present

## 2018-01-31 DIAGNOSIS — E538 Deficiency of other specified B group vitamins: Secondary | ICD-10-CM | POA: Diagnosis not present

## 2018-02-09 DIAGNOSIS — S62615D Displaced fracture of proximal phalanx of left ring finger, subsequent encounter for fracture with routine healing: Secondary | ICD-10-CM | POA: Diagnosis not present

## 2018-02-09 DIAGNOSIS — M25642 Stiffness of left hand, not elsewhere classified: Secondary | ICD-10-CM | POA: Diagnosis not present

## 2018-02-09 DIAGNOSIS — S62617A Displaced fracture of proximal phalanx of left little finger, initial encounter for closed fracture: Secondary | ICD-10-CM | POA: Diagnosis not present

## 2018-02-09 DIAGNOSIS — S62367D Nondisplaced fracture of neck of fifth metacarpal bone, left hand, subsequent encounter for fracture with routine healing: Secondary | ICD-10-CM | POA: Diagnosis not present

## 2018-02-09 DIAGNOSIS — S62367A Nondisplaced fracture of neck of fifth metacarpal bone, left hand, initial encounter for closed fracture: Secondary | ICD-10-CM | POA: Diagnosis not present

## 2018-02-09 DIAGNOSIS — S62615A Displaced fracture of proximal phalanx of left ring finger, initial encounter for closed fracture: Secondary | ICD-10-CM | POA: Diagnosis not present

## 2018-02-09 DIAGNOSIS — S62617D Displaced fracture of proximal phalanx of left little finger, subsequent encounter for fracture with routine healing: Secondary | ICD-10-CM | POA: Diagnosis not present

## 2018-02-09 DIAGNOSIS — M79642 Pain in left hand: Secondary | ICD-10-CM | POA: Diagnosis not present

## 2018-02-11 DIAGNOSIS — I672 Cerebral atherosclerosis: Secondary | ICD-10-CM | POA: Diagnosis not present

## 2018-02-11 DIAGNOSIS — I6522 Occlusion and stenosis of left carotid artery: Secondary | ICD-10-CM | POA: Diagnosis not present

## 2018-02-11 DIAGNOSIS — G319 Degenerative disease of nervous system, unspecified: Secondary | ICD-10-CM | POA: Diagnosis not present

## 2018-02-11 DIAGNOSIS — E1129 Type 2 diabetes mellitus with other diabetic kidney complication: Secondary | ICD-10-CM | POA: Diagnosis not present

## 2018-02-25 ENCOUNTER — Other Ambulatory Visit: Payer: Self-pay

## 2018-02-25 DIAGNOSIS — I6523 Occlusion and stenosis of bilateral carotid arteries: Secondary | ICD-10-CM

## 2018-03-01 ENCOUNTER — Ambulatory Visit (INDEPENDENT_AMBULATORY_CARE_PROVIDER_SITE_OTHER): Payer: Medicare Other | Admitting: Vascular Surgery

## 2018-03-01 ENCOUNTER — Encounter: Payer: Self-pay | Admitting: Vascular Surgery

## 2018-03-01 ENCOUNTER — Ambulatory Visit (HOSPITAL_COMMUNITY)
Admission: RE | Admit: 2018-03-01 | Discharge: 2018-03-01 | Disposition: A | Discharge status: Home / Self Care | Payer: Medicare Other | Source: Ambulatory Visit | Attending: Vascular Surgery | Admitting: Vascular Surgery

## 2018-03-01 DIAGNOSIS — I6529 Occlusion and stenosis of unspecified carotid artery: Secondary | ICD-10-CM

## 2018-03-01 DIAGNOSIS — I6523 Occlusion and stenosis of bilateral carotid arteries: Secondary | ICD-10-CM | POA: Insufficient documentation

## 2018-03-01 HISTORY — DX: Occlusion and stenosis of unspecified carotid artery: I65.29

## 2018-03-01 NOTE — Progress Notes (Signed)
Patient name: Terry Vasquez MRN: [id number] DOB: [birth year] Sex: male  REASON FOR CONSULT: Evaluate for carotid stenosis  HPI: Terry Vasquez is a 78 y.o. male, with history of COPD, diabetes, hypertension that presents for evaluation of carotid stenosis.  Patient states he had a fall in December when he fell on his left hand that prompted CT of the head as well as CT neck without contrast.  On this CT there was concern for bilateral dense carotid calcification (no degree of stenosis given non-contrast).  Patient ultimately ended up undergoing a carotid duplex which I did not have access to until today when he brought to his appointment.  This showed that he had a left velocity of 234/41 with concern for 70% stenosis and on the right had a 50 to 69% stenosis with a velocity of 173/26.  He denies any previous neck surgery or neck radiation.  He denies any symptoms of TIA or stroke.  He has had no vision loss or other unilateral focal weakness of an arm or leg.  He states his primary care doctor recently put him on Plavix and to stop his aspirin after the recent findings on CT and subsequent carotid duplex.  Past Medical History:  Diagnosis Date  . COPD (chronic obstructive pulmonary disease) ([place])   . Diabetes mellitus without complication ([place])   . Hypertension   . Inguinal hernia     Past Surgical History:  Procedure Laterality Date  . APPENDECTOMY    . INGUINAL HERNIA REPAIR    . SHOULDER SURGERY Right     No family history on file.  SOCIAL HISTORY: Social History   Socioeconomic History  . Marital status: Married    Spouse name: Not on file  . Number of children: Not on file  . Years of education: Not on file  . Highest education level: Not on file  Occupational History  . Not on file  Social Needs  . Financial resource strain: Not on file  . Food insecurity:    Worry: Not on file    Inability: Not on file  . Transportation needs:    Medical: Not on file    Non-medical:  Not on file  Tobacco Use  . Smoking status: Never Smoker  . Smokeless tobacco: Never Used  Substance and Sexual Activity  . Alcohol use: Never    Frequency: Never  . Drug use: Never  . Sexual activity: Not on file  Lifestyle  . Physical activity:    Days per week: Not on file    Minutes per session: Not on file  . Stress: Not on file  Relationships  . Social connections:    Talks on phone: Not on file    Gets together: Not on file    Attends religious service: Not on file    Active member of club or organization: Not on file    Attends meetings of clubs or organizations: Not on file    Relationship status: Not on file  . Intimate partner violence:    Fear of current or ex partner: Not on file    Emotionally abused: Not on file    Physically abused: Not on file    Forced sexual activity: Not on file  Other Topics Concern  . Not on file  Social History Narrative  . Not on file    Allergies  Allergen Reactions  . Actos [Pioglitazone]     Hypoglycemia, dehydration per patient   . Glipizide  Hypoglycemia   . Janumet [Sitagliptin-Metformin Hcl]     Lowered blood sugar  . Jardiance [Empagliflozin]     Lower blood sugar  . Metformin And Related   . Motrin [Ibuprofen]     "causes me to pass out"   . Other     Darvocet    Current Outpatient Medications  Medication Sig Dispense Refill  . amLODipine (NORVASC) 10 MG tablet 10 mg daily.    . clopidogrel (PLAVIX) 75 MG tablet 75 mg daily.    . cyanocobalamin (,VITAMIN B-12,) 1000 MCG/ML injection Inject into the muscle every 30 (thirty) days.    Marland Kitchen doxazosin (CARDURA) 4 MG tablet Take 4 mg by mouth daily.    . finasteride (PROSCAR) 5 MG tablet 5 mg daily.    . Fluticasone-Salmeterol (ADVAIR) 250-50 MCG/DOSE AEPB Inhale into the lungs 2 (two) times daily.    . hydrALAZINE (APRESOLINE) 10 MG tablet 10 mg 2 (two) times daily.    Marland Kitchen lisinopril (PRINIVIL,ZESTRIL) 10 MG tablet 10 mg daily.    . Omega-3 1000 MG CAPS Take  1,000 mg by mouth daily.    . ONGLYZA 5 MG TABS tablet 5 mg daily.    . rosuvastatin (CRESTOR) 10 MG tablet 10 mg daily.     No current facility-administered medications for this visit.     REVIEW OF SYSTEMS:  [X]  denotes positive finding, [ ]  denotes negative finding Cardiac  Comments:  Chest pain or chest pressure:    Shortness of breath upon exertion: x   Short of breath when lying flat:    Irregular heart rhythm:        Vascular    Pain in calf, thigh, or hip brought on by ambulation:    Pain in feet at night that wakes you up from your sleep:     Blood clot in your veins:    Leg swelling:         Pulmonary    Oxygen at home:    Productive cough:     Wheezing:         Neurologic    Sudden weakness in arms or legs:     Sudden numbness in arms or legs:     Sudden onset of difficulty speaking or slurred speech:    Temporary loss of vision in one eye:     Problems with dizziness:         Gastrointestinal    Blood in stool:     Vomited blood:         Genitourinary    Burning when urinating:     Blood in urine:        Psychiatric    Major depression:         Hematologic    Bleeding problems:    Problems with blood clotting too easily:        Skin    Rashes or ulcers:        Constitutional    Fever or chills:      PHYSICAL EXAM: Vitals:   03/01/18 1131  BP: (!) 148/66  Pulse: 70  Resp: 16  SpO2: 97%  Weight: 176 lb 0.6 oz (79.9 kg)  Height: 5\' 9"  (1.753 m)    GENERAL: The patient is a well-nourished male, in no acute distress. The vital signs are documented above. CARDIAC: There is a regular rate and rhythm.  VASCULAR:  2+ radial pulse bilaterally 2+ femoral pulse bilaterally Right carotid bruit on exam 2+ carotid pulse bilaterally  PULMONARY: There is good air exchange bilaterally without wheezing or rales. ABDOMEN: Soft and non-tender with normal pitched bowel sounds.  MUSCULOSKELETAL: There are no major deformities or cyanosis. NEUROLOGIC: No  focal weakness or paresthesias are detected. SKIN: There are no ulcers or rashes noted. PSYCHIATRIC: The patient has a normal affect.  DATA:   I independently reviewed his carotid duplex repeated here and the right ICA has a velocity of 180/14 where at the left ICA has a velocity of 207/40 (40-59%).  Assessment/Plan:  78 year old male that presents with asymptomatic carotid stenosis.  I had asked for a carotid duplex prior to his appointment today because I did not see that it had been completed and I was only able to review his CT head and neck without contrast that showed calcification of his bilateral carotid arteries.  His duplex here suggest that his left side is higher grade with a closer to 60% stenosis.  He actually has had a duplex that he brought with him today and this showed a 70% stenosis on outside imaging.  Given that he remains asymptomatic I discussed with him that we typically wait until the disease is greater than 80% stenosis to offer intervention.  Discussed that if he does have a TIA or stroke in the future we intervene at greater than 50% stenosis.  Given that his two duplex findings were slightly different discussed CTA neck, but given that he has chronic kidney disease and is seeing a nephrologist and his GFR is less than 40 will delay.  Will plan for repeat carotid duplex in 6 months and if 80% will intervene in setting of asymptomatic disease.  He can remain on Plavix.     Marty Heck, MD [facility] Office: [phone] Pager: [phone]

## 2018-03-03 DIAGNOSIS — E538 Deficiency of other specified B group vitamins: Secondary | ICD-10-CM | POA: Diagnosis not present

## 2018-03-11 DIAGNOSIS — I6522 Occlusion and stenosis of left carotid artery: Secondary | ICD-10-CM | POA: Diagnosis not present

## 2018-03-11 DIAGNOSIS — E1129 Type 2 diabetes mellitus with other diabetic kidney complication: Secondary | ICD-10-CM | POA: Diagnosis not present

## 2018-03-11 DIAGNOSIS — I672 Cerebral atherosclerosis: Secondary | ICD-10-CM | POA: Diagnosis not present

## 2018-03-11 DIAGNOSIS — G319 Degenerative disease of nervous system, unspecified: Secondary | ICD-10-CM | POA: Diagnosis not present

## 2018-03-14 DIAGNOSIS — E1159 Type 2 diabetes mellitus with other circulatory complications: Secondary | ICD-10-CM | POA: Diagnosis not present

## 2018-03-14 DIAGNOSIS — Z961 Presence of intraocular lens: Secondary | ICD-10-CM | POA: Diagnosis not present

## 2018-03-14 DIAGNOSIS — E1169 Type 2 diabetes mellitus with other specified complication: Secondary | ICD-10-CM | POA: Diagnosis not present

## 2018-03-14 DIAGNOSIS — E113293 Type 2 diabetes mellitus with mild nonproliferative diabetic retinopathy without macular edema, bilateral: Secondary | ICD-10-CM | POA: Diagnosis not present

## 2018-03-14 DIAGNOSIS — E1129 Type 2 diabetes mellitus with other diabetic kidney complication: Secondary | ICD-10-CM | POA: Diagnosis not present

## 2018-03-16 DIAGNOSIS — S62615D Displaced fracture of proximal phalanx of left ring finger, subsequent encounter for fracture with routine healing: Secondary | ICD-10-CM | POA: Diagnosis not present

## 2018-03-16 DIAGNOSIS — S62367D Nondisplaced fracture of neck of fifth metacarpal bone, left hand, subsequent encounter for fracture with routine healing: Secondary | ICD-10-CM | POA: Diagnosis not present

## 2018-03-16 DIAGNOSIS — S62617D Displaced fracture of proximal phalanx of left little finger, subsequent encounter for fracture with routine healing: Secondary | ICD-10-CM | POA: Diagnosis not present

## 2018-03-21 DIAGNOSIS — E1129 Type 2 diabetes mellitus with other diabetic kidney complication: Secondary | ICD-10-CM | POA: Diagnosis not present

## 2018-03-21 DIAGNOSIS — E1169 Type 2 diabetes mellitus with other specified complication: Secondary | ICD-10-CM | POA: Diagnosis not present

## 2018-03-21 DIAGNOSIS — I10 Essential (primary) hypertension: Secondary | ICD-10-CM | POA: Diagnosis not present

## 2018-03-21 DIAGNOSIS — E1159 Type 2 diabetes mellitus with other circulatory complications: Secondary | ICD-10-CM | POA: Diagnosis not present

## 2018-04-01 DIAGNOSIS — E538 Deficiency of other specified B group vitamins: Secondary | ICD-10-CM | POA: Diagnosis not present

## 2018-04-11 DIAGNOSIS — N401 Enlarged prostate with lower urinary tract symptoms: Secondary | ICD-10-CM | POA: Diagnosis not present

## 2018-04-11 DIAGNOSIS — R972 Elevated prostate specific antigen [PSA]: Secondary | ICD-10-CM | POA: Diagnosis not present

## 2018-04-12 DIAGNOSIS — E1159 Type 2 diabetes mellitus with other circulatory complications: Secondary | ICD-10-CM | POA: Diagnosis not present

## 2018-04-12 DIAGNOSIS — I10 Essential (primary) hypertension: Secondary | ICD-10-CM | POA: Diagnosis not present

## 2018-04-12 DIAGNOSIS — E1169 Type 2 diabetes mellitus with other specified complication: Secondary | ICD-10-CM | POA: Diagnosis not present

## 2018-04-12 DIAGNOSIS — E1129 Type 2 diabetes mellitus with other diabetic kidney complication: Secondary | ICD-10-CM | POA: Diagnosis not present

## 2018-05-02 DIAGNOSIS — E538 Deficiency of other specified B group vitamins: Secondary | ICD-10-CM | POA: Diagnosis not present

## 2018-05-12 DIAGNOSIS — E1169 Type 2 diabetes mellitus with other specified complication: Secondary | ICD-10-CM | POA: Diagnosis not present

## 2018-05-12 DIAGNOSIS — E1159 Type 2 diabetes mellitus with other circulatory complications: Secondary | ICD-10-CM | POA: Diagnosis not present

## 2018-05-12 DIAGNOSIS — I10 Essential (primary) hypertension: Secondary | ICD-10-CM | POA: Diagnosis not present

## 2018-05-12 DIAGNOSIS — E1129 Type 2 diabetes mellitus with other diabetic kidney complication: Secondary | ICD-10-CM | POA: Diagnosis not present

## 2018-05-23 DIAGNOSIS — N183 Chronic kidney disease, stage 3 (moderate): Secondary | ICD-10-CM | POA: Diagnosis not present

## 2018-06-01 DIAGNOSIS — E538 Deficiency of other specified B group vitamins: Secondary | ICD-10-CM | POA: Diagnosis not present

## 2018-06-02 DIAGNOSIS — I129 Hypertensive chronic kidney disease with stage 1 through stage 4 chronic kidney disease, or unspecified chronic kidney disease: Secondary | ICD-10-CM | POA: Diagnosis not present

## 2018-06-02 DIAGNOSIS — N183 Chronic kidney disease, stage 3 (moderate): Secondary | ICD-10-CM | POA: Diagnosis not present

## 2018-06-02 DIAGNOSIS — D631 Anemia in chronic kidney disease: Secondary | ICD-10-CM | POA: Diagnosis not present

## 2018-06-02 DIAGNOSIS — E559 Vitamin D deficiency, unspecified: Secondary | ICD-10-CM | POA: Diagnosis not present

## 2018-06-10 DIAGNOSIS — E1169 Type 2 diabetes mellitus with other specified complication: Secondary | ICD-10-CM | POA: Diagnosis not present

## 2018-06-10 DIAGNOSIS — E1159 Type 2 diabetes mellitus with other circulatory complications: Secondary | ICD-10-CM | POA: Diagnosis not present

## 2018-06-10 DIAGNOSIS — E1129 Type 2 diabetes mellitus with other diabetic kidney complication: Secondary | ICD-10-CM | POA: Diagnosis not present

## 2018-06-10 DIAGNOSIS — I10 Essential (primary) hypertension: Secondary | ICD-10-CM | POA: Diagnosis not present

## 2018-07-01 DIAGNOSIS — E538 Deficiency of other specified B group vitamins: Secondary | ICD-10-CM | POA: Diagnosis not present

## 2018-07-12 DIAGNOSIS — E1129 Type 2 diabetes mellitus with other diabetic kidney complication: Secondary | ICD-10-CM | POA: Diagnosis not present

## 2018-07-12 DIAGNOSIS — I10 Essential (primary) hypertension: Secondary | ICD-10-CM | POA: Diagnosis not present

## 2018-07-12 DIAGNOSIS — E1169 Type 2 diabetes mellitus with other specified complication: Secondary | ICD-10-CM | POA: Diagnosis not present

## 2018-07-12 DIAGNOSIS — E1159 Type 2 diabetes mellitus with other circulatory complications: Secondary | ICD-10-CM | POA: Diagnosis not present

## 2018-07-19 DIAGNOSIS — J449 Chronic obstructive pulmonary disease, unspecified: Secondary | ICD-10-CM | POA: Diagnosis not present

## 2018-07-19 DIAGNOSIS — J301 Allergic rhinitis due to pollen: Secondary | ICD-10-CM | POA: Diagnosis not present

## 2018-07-19 DIAGNOSIS — R05 Cough: Secondary | ICD-10-CM | POA: Diagnosis not present

## 2018-08-01 DIAGNOSIS — E538 Deficiency of other specified B group vitamins: Secondary | ICD-10-CM | POA: Diagnosis not present

## 2018-08-11 DIAGNOSIS — E1169 Type 2 diabetes mellitus with other specified complication: Secondary | ICD-10-CM | POA: Diagnosis not present

## 2018-08-11 DIAGNOSIS — E1159 Type 2 diabetes mellitus with other circulatory complications: Secondary | ICD-10-CM | POA: Diagnosis not present

## 2018-08-11 DIAGNOSIS — E1129 Type 2 diabetes mellitus with other diabetic kidney complication: Secondary | ICD-10-CM | POA: Diagnosis not present

## 2018-08-12 DIAGNOSIS — E1129 Type 2 diabetes mellitus with other diabetic kidney complication: Secondary | ICD-10-CM | POA: Diagnosis not present

## 2018-08-12 DIAGNOSIS — E1169 Type 2 diabetes mellitus with other specified complication: Secondary | ICD-10-CM | POA: Diagnosis not present

## 2018-08-12 DIAGNOSIS — E1159 Type 2 diabetes mellitus with other circulatory complications: Secondary | ICD-10-CM | POA: Diagnosis not present

## 2018-08-12 DIAGNOSIS — I10 Essential (primary) hypertension: Secondary | ICD-10-CM | POA: Diagnosis not present

## 2018-08-19 DIAGNOSIS — E1129 Type 2 diabetes mellitus with other diabetic kidney complication: Secondary | ICD-10-CM | POA: Diagnosis not present

## 2018-08-19 DIAGNOSIS — E1169 Type 2 diabetes mellitus with other specified complication: Secondary | ICD-10-CM | POA: Diagnosis not present

## 2018-08-19 DIAGNOSIS — E785 Hyperlipidemia, unspecified: Secondary | ICD-10-CM | POA: Diagnosis not present

## 2018-08-19 DIAGNOSIS — E1159 Type 2 diabetes mellitus with other circulatory complications: Secondary | ICD-10-CM | POA: Diagnosis not present

## 2018-08-31 DIAGNOSIS — E1169 Type 2 diabetes mellitus with other specified complication: Secondary | ICD-10-CM | POA: Diagnosis not present

## 2018-08-31 DIAGNOSIS — E1159 Type 2 diabetes mellitus with other circulatory complications: Secondary | ICD-10-CM | POA: Diagnosis not present

## 2018-09-01 DIAGNOSIS — E538 Deficiency of other specified B group vitamins: Secondary | ICD-10-CM | POA: Diagnosis not present

## 2018-09-12 DIAGNOSIS — E1129 Type 2 diabetes mellitus with other diabetic kidney complication: Secondary | ICD-10-CM | POA: Diagnosis not present

## 2018-09-12 DIAGNOSIS — E1169 Type 2 diabetes mellitus with other specified complication: Secondary | ICD-10-CM | POA: Diagnosis not present

## 2018-09-12 DIAGNOSIS — E1159 Type 2 diabetes mellitus with other circulatory complications: Secondary | ICD-10-CM | POA: Diagnosis not present

## 2018-09-12 DIAGNOSIS — E785 Hyperlipidemia, unspecified: Secondary | ICD-10-CM | POA: Diagnosis not present

## 2018-09-23 DIAGNOSIS — Z6825 Body mass index (BMI) 25.0-25.9, adult: Secondary | ICD-10-CM | POA: Diagnosis not present

## 2018-09-23 DIAGNOSIS — N183 Chronic kidney disease, stage 3 (moderate): Secondary | ICD-10-CM | POA: Diagnosis not present

## 2018-09-23 DIAGNOSIS — E538 Deficiency of other specified B group vitamins: Secondary | ICD-10-CM | POA: Diagnosis not present

## 2018-10-03 DIAGNOSIS — E538 Deficiency of other specified B group vitamins: Secondary | ICD-10-CM | POA: Diagnosis not present

## 2018-10-07 DIAGNOSIS — Z23 Encounter for immunization: Secondary | ICD-10-CM | POA: Diagnosis not present

## 2018-10-12 DIAGNOSIS — R351 Nocturia: Secondary | ICD-10-CM | POA: Diagnosis not present

## 2018-10-12 DIAGNOSIS — N401 Enlarged prostate with lower urinary tract symptoms: Secondary | ICD-10-CM | POA: Diagnosis not present

## 2018-10-12 DIAGNOSIS — N183 Chronic kidney disease, stage 3 (moderate): Secondary | ICD-10-CM | POA: Diagnosis not present

## 2018-10-12 DIAGNOSIS — E1169 Type 2 diabetes mellitus with other specified complication: Secondary | ICD-10-CM | POA: Diagnosis not present

## 2018-10-12 DIAGNOSIS — E785 Hyperlipidemia, unspecified: Secondary | ICD-10-CM | POA: Diagnosis not present

## 2018-10-12 DIAGNOSIS — E1129 Type 2 diabetes mellitus with other diabetic kidney complication: Secondary | ICD-10-CM | POA: Diagnosis not present

## 2018-11-02 DIAGNOSIS — E538 Deficiency of other specified B group vitamins: Secondary | ICD-10-CM | POA: Diagnosis not present

## 2018-11-11 DIAGNOSIS — E1129 Type 2 diabetes mellitus with other diabetic kidney complication: Secondary | ICD-10-CM | POA: Diagnosis not present

## 2018-11-11 DIAGNOSIS — E785 Hyperlipidemia, unspecified: Secondary | ICD-10-CM | POA: Diagnosis not present

## 2018-11-11 DIAGNOSIS — E1169 Type 2 diabetes mellitus with other specified complication: Secondary | ICD-10-CM | POA: Diagnosis not present

## 2018-12-02 DIAGNOSIS — E538 Deficiency of other specified B group vitamins: Secondary | ICD-10-CM | POA: Diagnosis not present

## 2018-12-12 DIAGNOSIS — E1169 Type 2 diabetes mellitus with other specified complication: Secondary | ICD-10-CM | POA: Diagnosis not present

## 2018-12-12 DIAGNOSIS — E785 Hyperlipidemia, unspecified: Secondary | ICD-10-CM | POA: Diagnosis not present

## 2018-12-12 DIAGNOSIS — E1129 Type 2 diabetes mellitus with other diabetic kidney complication: Secondary | ICD-10-CM | POA: Diagnosis not present

## 2018-12-19 DIAGNOSIS — Z7189 Other specified counseling: Secondary | ICD-10-CM | POA: Diagnosis not present

## 2018-12-19 DIAGNOSIS — Z0000 Encounter for general adult medical examination without abnormal findings: Secondary | ICD-10-CM | POA: Diagnosis not present

## 2018-12-19 DIAGNOSIS — E1169 Type 2 diabetes mellitus with other specified complication: Secondary | ICD-10-CM | POA: Diagnosis not present

## 2018-12-19 DIAGNOSIS — Z1331 Encounter for screening for depression: Secondary | ICD-10-CM | POA: Diagnosis not present

## 2018-12-19 DIAGNOSIS — Z136 Encounter for screening for cardiovascular disorders: Secondary | ICD-10-CM | POA: Diagnosis not present

## 2018-12-19 DIAGNOSIS — Z1339 Encounter for screening examination for other mental health and behavioral disorders: Secondary | ICD-10-CM | POA: Diagnosis not present

## 2018-12-19 DIAGNOSIS — E785 Hyperlipidemia, unspecified: Secondary | ICD-10-CM | POA: Diagnosis not present

## 2018-12-19 DIAGNOSIS — Z139 Encounter for screening, unspecified: Secondary | ICD-10-CM | POA: Diagnosis not present

## 2018-12-19 DIAGNOSIS — I130 Hypertensive heart and chronic kidney disease with heart failure and stage 1 through stage 4 chronic kidney disease, or unspecified chronic kidney disease: Secondary | ICD-10-CM | POA: Diagnosis not present

## 2018-12-19 DIAGNOSIS — N1832 Chronic kidney disease, stage 3b: Secondary | ICD-10-CM | POA: Diagnosis not present

## 2019-01-18 DIAGNOSIS — J449 Chronic obstructive pulmonary disease, unspecified: Secondary | ICD-10-CM | POA: Diagnosis not present

## 2019-01-18 DIAGNOSIS — R05 Cough: Secondary | ICD-10-CM | POA: Diagnosis not present

## 2019-01-18 DIAGNOSIS — J301 Allergic rhinitis due to pollen: Secondary | ICD-10-CM | POA: Diagnosis not present

## 2019-02-02 DIAGNOSIS — E538 Deficiency of other specified B group vitamins: Secondary | ICD-10-CM | POA: Diagnosis not present

## 2019-02-10 DIAGNOSIS — N1832 Chronic kidney disease, stage 3b: Secondary | ICD-10-CM | POA: Diagnosis not present

## 2019-02-10 DIAGNOSIS — E785 Hyperlipidemia, unspecified: Secondary | ICD-10-CM | POA: Diagnosis not present

## 2019-02-10 DIAGNOSIS — E1169 Type 2 diabetes mellitus with other specified complication: Secondary | ICD-10-CM | POA: Diagnosis not present

## 2019-02-10 DIAGNOSIS — I130 Hypertensive heart and chronic kidney disease with heart failure and stage 1 through stage 4 chronic kidney disease, or unspecified chronic kidney disease: Secondary | ICD-10-CM | POA: Diagnosis not present

## 2019-02-28 DIAGNOSIS — N183 Chronic kidney disease, stage 3 unspecified: Secondary | ICD-10-CM | POA: Diagnosis not present

## 2019-03-07 DIAGNOSIS — E538 Deficiency of other specified B group vitamins: Secondary | ICD-10-CM | POA: Diagnosis not present

## 2019-03-10 DIAGNOSIS — N183 Chronic kidney disease, stage 3 unspecified: Secondary | ICD-10-CM | POA: Diagnosis not present

## 2019-03-10 DIAGNOSIS — D631 Anemia in chronic kidney disease: Secondary | ICD-10-CM | POA: Diagnosis not present

## 2019-03-10 DIAGNOSIS — I129 Hypertensive chronic kidney disease with stage 1 through stage 4 chronic kidney disease, or unspecified chronic kidney disease: Secondary | ICD-10-CM | POA: Diagnosis not present

## 2019-03-10 DIAGNOSIS — N189 Chronic kidney disease, unspecified: Secondary | ICD-10-CM | POA: Diagnosis not present

## 2019-03-10 DIAGNOSIS — E559 Vitamin D deficiency, unspecified: Secondary | ICD-10-CM | POA: Diagnosis not present

## 2019-03-12 DIAGNOSIS — E785 Hyperlipidemia, unspecified: Secondary | ICD-10-CM | POA: Diagnosis not present

## 2019-03-12 DIAGNOSIS — I130 Hypertensive heart and chronic kidney disease with heart failure and stage 1 through stage 4 chronic kidney disease, or unspecified chronic kidney disease: Secondary | ICD-10-CM | POA: Diagnosis not present

## 2019-03-12 DIAGNOSIS — E1169 Type 2 diabetes mellitus with other specified complication: Secondary | ICD-10-CM | POA: Diagnosis not present

## 2019-03-12 DIAGNOSIS — N1832 Chronic kidney disease, stage 3b: Secondary | ICD-10-CM | POA: Diagnosis not present

## 2019-03-15 DIAGNOSIS — N189 Chronic kidney disease, unspecified: Secondary | ICD-10-CM | POA: Diagnosis not present

## 2019-04-04 DIAGNOSIS — E538 Deficiency of other specified B group vitamins: Secondary | ICD-10-CM | POA: Diagnosis not present

## 2019-04-05 DIAGNOSIS — N183 Chronic kidney disease, stage 3 unspecified: Secondary | ICD-10-CM | POA: Diagnosis not present

## 2019-04-05 DIAGNOSIS — D631 Anemia in chronic kidney disease: Secondary | ICD-10-CM | POA: Diagnosis not present

## 2019-04-06 DIAGNOSIS — N189 Chronic kidney disease, unspecified: Secondary | ICD-10-CM | POA: Diagnosis not present

## 2019-04-06 DIAGNOSIS — D631 Anemia in chronic kidney disease: Secondary | ICD-10-CM | POA: Diagnosis not present

## 2019-04-10 DIAGNOSIS — R351 Nocturia: Secondary | ICD-10-CM | POA: Diagnosis not present

## 2019-04-10 DIAGNOSIS — N401 Enlarged prostate with lower urinary tract symptoms: Secondary | ICD-10-CM | POA: Diagnosis not present

## 2019-04-12 DIAGNOSIS — N1832 Chronic kidney disease, stage 3b: Secondary | ICD-10-CM | POA: Diagnosis not present

## 2019-04-12 DIAGNOSIS — I130 Hypertensive heart and chronic kidney disease with heart failure and stage 1 through stage 4 chronic kidney disease, or unspecified chronic kidney disease: Secondary | ICD-10-CM | POA: Diagnosis not present

## 2019-04-12 DIAGNOSIS — E785 Hyperlipidemia, unspecified: Secondary | ICD-10-CM | POA: Diagnosis not present

## 2019-04-12 DIAGNOSIS — E1169 Type 2 diabetes mellitus with other specified complication: Secondary | ICD-10-CM | POA: Diagnosis not present

## 2019-04-13 DIAGNOSIS — N189 Chronic kidney disease, unspecified: Secondary | ICD-10-CM | POA: Diagnosis not present

## 2019-04-13 DIAGNOSIS — D631 Anemia in chronic kidney disease: Secondary | ICD-10-CM | POA: Diagnosis not present

## 2019-04-19 DIAGNOSIS — D631 Anemia in chronic kidney disease: Secondary | ICD-10-CM | POA: Diagnosis not present

## 2019-04-19 DIAGNOSIS — N183 Chronic kidney disease, stage 3 unspecified: Secondary | ICD-10-CM | POA: Diagnosis not present

## 2019-05-01 DIAGNOSIS — E1169 Type 2 diabetes mellitus with other specified complication: Secondary | ICD-10-CM | POA: Diagnosis not present

## 2019-05-03 DIAGNOSIS — D631 Anemia in chronic kidney disease: Secondary | ICD-10-CM | POA: Diagnosis not present

## 2019-05-03 DIAGNOSIS — N183 Chronic kidney disease, stage 3 unspecified: Secondary | ICD-10-CM | POA: Diagnosis not present

## 2019-05-08 DIAGNOSIS — E1129 Type 2 diabetes mellitus with other diabetic kidney complication: Secondary | ICD-10-CM | POA: Diagnosis not present

## 2019-05-08 DIAGNOSIS — E538 Deficiency of other specified B group vitamins: Secondary | ICD-10-CM | POA: Diagnosis not present

## 2019-05-08 DIAGNOSIS — E1169 Type 2 diabetes mellitus with other specified complication: Secondary | ICD-10-CM | POA: Diagnosis not present

## 2019-05-08 DIAGNOSIS — N184 Chronic kidney disease, stage 4 (severe): Secondary | ICD-10-CM | POA: Diagnosis not present

## 2019-05-08 DIAGNOSIS — E785 Hyperlipidemia, unspecified: Secondary | ICD-10-CM | POA: Diagnosis not present

## 2019-05-12 DIAGNOSIS — E785 Hyperlipidemia, unspecified: Secondary | ICD-10-CM | POA: Diagnosis not present

## 2019-05-12 DIAGNOSIS — E1169 Type 2 diabetes mellitus with other specified complication: Secondary | ICD-10-CM | POA: Diagnosis not present

## 2019-05-12 DIAGNOSIS — E1129 Type 2 diabetes mellitus with other diabetic kidney complication: Secondary | ICD-10-CM | POA: Diagnosis not present

## 2019-05-12 DIAGNOSIS — E1159 Type 2 diabetes mellitus with other circulatory complications: Secondary | ICD-10-CM | POA: Diagnosis not present

## 2019-05-17 DIAGNOSIS — N183 Chronic kidney disease, stage 3 unspecified: Secondary | ICD-10-CM | POA: Diagnosis not present

## 2019-05-17 DIAGNOSIS — D631 Anemia in chronic kidney disease: Secondary | ICD-10-CM | POA: Diagnosis not present

## 2019-05-31 DIAGNOSIS — D631 Anemia in chronic kidney disease: Secondary | ICD-10-CM | POA: Diagnosis not present

## 2019-05-31 DIAGNOSIS — N183 Chronic kidney disease, stage 3 unspecified: Secondary | ICD-10-CM | POA: Diagnosis not present

## 2019-06-07 DIAGNOSIS — D631 Anemia in chronic kidney disease: Secondary | ICD-10-CM | POA: Diagnosis not present

## 2019-06-07 DIAGNOSIS — E559 Vitamin D deficiency, unspecified: Secondary | ICD-10-CM | POA: Diagnosis not present

## 2019-06-07 DIAGNOSIS — I129 Hypertensive chronic kidney disease with stage 1 through stage 4 chronic kidney disease, or unspecified chronic kidney disease: Secondary | ICD-10-CM | POA: Diagnosis not present

## 2019-06-07 DIAGNOSIS — N183 Chronic kidney disease, stage 3 unspecified: Secondary | ICD-10-CM | POA: Diagnosis not present

## 2019-06-08 DIAGNOSIS — E538 Deficiency of other specified B group vitamins: Secondary | ICD-10-CM | POA: Diagnosis not present

## 2019-06-12 DIAGNOSIS — E1159 Type 2 diabetes mellitus with other circulatory complications: Secondary | ICD-10-CM | POA: Diagnosis not present

## 2019-06-12 DIAGNOSIS — E1169 Type 2 diabetes mellitus with other specified complication: Secondary | ICD-10-CM | POA: Diagnosis not present

## 2019-06-12 DIAGNOSIS — E1129 Type 2 diabetes mellitus with other diabetic kidney complication: Secondary | ICD-10-CM | POA: Diagnosis not present

## 2019-06-12 DIAGNOSIS — E785 Hyperlipidemia, unspecified: Secondary | ICD-10-CM | POA: Diagnosis not present

## 2019-06-14 DIAGNOSIS — N183 Chronic kidney disease, stage 3 unspecified: Secondary | ICD-10-CM | POA: Diagnosis not present

## 2019-06-14 DIAGNOSIS — D631 Anemia in chronic kidney disease: Secondary | ICD-10-CM | POA: Diagnosis not present

## 2019-06-20 DIAGNOSIS — E559 Vitamin D deficiency, unspecified: Secondary | ICD-10-CM | POA: Diagnosis not present

## 2019-06-20 DIAGNOSIS — E538 Deficiency of other specified B group vitamins: Secondary | ICD-10-CM | POA: Diagnosis not present

## 2019-06-20 DIAGNOSIS — D631 Anemia in chronic kidney disease: Secondary | ICD-10-CM | POA: Diagnosis not present

## 2019-06-20 DIAGNOSIS — N189 Chronic kidney disease, unspecified: Secondary | ICD-10-CM | POA: Diagnosis not present

## 2019-07-19 DIAGNOSIS — N183 Chronic kidney disease, stage 3 unspecified: Secondary | ICD-10-CM | POA: Diagnosis not present

## 2019-07-19 DIAGNOSIS — D631 Anemia in chronic kidney disease: Secondary | ICD-10-CM | POA: Diagnosis not present

## 2019-07-25 DIAGNOSIS — J301 Allergic rhinitis due to pollen: Secondary | ICD-10-CM | POA: Diagnosis not present

## 2019-07-25 DIAGNOSIS — J449 Chronic obstructive pulmonary disease, unspecified: Secondary | ICD-10-CM | POA: Diagnosis not present

## 2019-08-03 DIAGNOSIS — E1169 Type 2 diabetes mellitus with other specified complication: Secondary | ICD-10-CM | POA: Diagnosis not present

## 2019-08-09 DIAGNOSIS — D631 Anemia in chronic kidney disease: Secondary | ICD-10-CM | POA: Diagnosis not present

## 2019-08-09 DIAGNOSIS — N183 Chronic kidney disease, stage 3 unspecified: Secondary | ICD-10-CM | POA: Diagnosis not present

## 2019-08-11 DIAGNOSIS — E538 Deficiency of other specified B group vitamins: Secondary | ICD-10-CM | POA: Diagnosis not present

## 2019-08-11 DIAGNOSIS — E785 Hyperlipidemia, unspecified: Secondary | ICD-10-CM | POA: Diagnosis not present

## 2019-08-11 DIAGNOSIS — E1159 Type 2 diabetes mellitus with other circulatory complications: Secondary | ICD-10-CM | POA: Diagnosis not present

## 2019-08-11 DIAGNOSIS — E1169 Type 2 diabetes mellitus with other specified complication: Secondary | ICD-10-CM | POA: Diagnosis not present

## 2019-08-11 DIAGNOSIS — I152 Hypertension secondary to endocrine disorders: Secondary | ICD-10-CM | POA: Diagnosis not present

## 2019-08-13 DIAGNOSIS — E1169 Type 2 diabetes mellitus with other specified complication: Secondary | ICD-10-CM | POA: Diagnosis not present

## 2019-08-13 DIAGNOSIS — N184 Chronic kidney disease, stage 4 (severe): Secondary | ICD-10-CM | POA: Diagnosis not present

## 2019-08-13 DIAGNOSIS — E1129 Type 2 diabetes mellitus with other diabetic kidney complication: Secondary | ICD-10-CM | POA: Diagnosis not present

## 2019-08-13 DIAGNOSIS — E785 Hyperlipidemia, unspecified: Secondary | ICD-10-CM | POA: Diagnosis not present

## 2019-08-30 DIAGNOSIS — D631 Anemia in chronic kidney disease: Secondary | ICD-10-CM | POA: Diagnosis not present

## 2019-08-30 DIAGNOSIS — N183 Chronic kidney disease, stage 3 unspecified: Secondary | ICD-10-CM | POA: Diagnosis not present

## 2019-09-11 DIAGNOSIS — E538 Deficiency of other specified B group vitamins: Secondary | ICD-10-CM | POA: Diagnosis not present

## 2019-09-12 DIAGNOSIS — E1129 Type 2 diabetes mellitus with other diabetic kidney complication: Secondary | ICD-10-CM | POA: Diagnosis not present

## 2019-09-12 DIAGNOSIS — N184 Chronic kidney disease, stage 4 (severe): Secondary | ICD-10-CM | POA: Diagnosis not present

## 2019-09-12 DIAGNOSIS — E1169 Type 2 diabetes mellitus with other specified complication: Secondary | ICD-10-CM | POA: Diagnosis not present

## 2019-09-12 DIAGNOSIS — I152 Hypertension secondary to endocrine disorders: Secondary | ICD-10-CM | POA: Diagnosis not present

## 2019-09-20 DIAGNOSIS — N183 Chronic kidney disease, stage 3 unspecified: Secondary | ICD-10-CM | POA: Diagnosis not present

## 2019-09-20 DIAGNOSIS — D631 Anemia in chronic kidney disease: Secondary | ICD-10-CM | POA: Diagnosis not present

## 2019-10-11 DIAGNOSIS — N401 Enlarged prostate with lower urinary tract symptoms: Secondary | ICD-10-CM | POA: Diagnosis not present

## 2019-10-11 DIAGNOSIS — R351 Nocturia: Secondary | ICD-10-CM | POA: Diagnosis not present

## 2019-10-12 DIAGNOSIS — E538 Deficiency of other specified B group vitamins: Secondary | ICD-10-CM | POA: Diagnosis not present

## 2019-10-13 DIAGNOSIS — D631 Anemia in chronic kidney disease: Secondary | ICD-10-CM | POA: Diagnosis not present

## 2019-10-13 DIAGNOSIS — N183 Chronic kidney disease, stage 3 unspecified: Secondary | ICD-10-CM | POA: Diagnosis not present

## 2019-10-23 DIAGNOSIS — Z23 Encounter for immunization: Secondary | ICD-10-CM | POA: Diagnosis not present

## 2019-10-24 DIAGNOSIS — J449 Chronic obstructive pulmonary disease, unspecified: Secondary | ICD-10-CM | POA: Diagnosis not present

## 2019-10-24 DIAGNOSIS — J301 Allergic rhinitis due to pollen: Secondary | ICD-10-CM | POA: Diagnosis not present

## 2019-11-03 DIAGNOSIS — D631 Anemia in chronic kidney disease: Secondary | ICD-10-CM | POA: Diagnosis not present

## 2019-11-03 DIAGNOSIS — N183 Chronic kidney disease, stage 3 unspecified: Secondary | ICD-10-CM | POA: Diagnosis not present

## 2019-11-10 DIAGNOSIS — E538 Deficiency of other specified B group vitamins: Secondary | ICD-10-CM | POA: Diagnosis not present

## 2019-11-24 DIAGNOSIS — D631 Anemia in chronic kidney disease: Secondary | ICD-10-CM | POA: Diagnosis not present

## 2019-11-24 DIAGNOSIS — N183 Chronic kidney disease, stage 3 unspecified: Secondary | ICD-10-CM | POA: Diagnosis not present

## 2019-12-05 DIAGNOSIS — E1169 Type 2 diabetes mellitus with other specified complication: Secondary | ICD-10-CM | POA: Diagnosis not present

## 2019-12-11 DIAGNOSIS — E1159 Type 2 diabetes mellitus with other circulatory complications: Secondary | ICD-10-CM | POA: Diagnosis not present

## 2019-12-11 DIAGNOSIS — E1169 Type 2 diabetes mellitus with other specified complication: Secondary | ICD-10-CM | POA: Diagnosis not present

## 2019-12-11 DIAGNOSIS — E785 Hyperlipidemia, unspecified: Secondary | ICD-10-CM | POA: Diagnosis not present

## 2019-12-11 DIAGNOSIS — E538 Deficiency of other specified B group vitamins: Secondary | ICD-10-CM | POA: Diagnosis not present

## 2019-12-11 DIAGNOSIS — I152 Hypertension secondary to endocrine disorders: Secondary | ICD-10-CM | POA: Diagnosis not present

## 2019-12-15 DIAGNOSIS — N183 Chronic kidney disease, stage 3 unspecified: Secondary | ICD-10-CM | POA: Diagnosis not present

## 2019-12-15 DIAGNOSIS — D631 Anemia in chronic kidney disease: Secondary | ICD-10-CM | POA: Diagnosis not present

## 2020-01-03 IMAGING — CT CT MAXILLOFACIAL W/O CM
6 of 14 series · 15 of 47 positions shown, 16 images · non-contrast
Comparison: None.

CLINICAL DATA: Fell and hit his head on a sidewalk curb this
morning. Associated abrasion and hematoma. The patient takes 81 mg
of aspirin daily.

EXAM:
CT HEAD WITHOUT CONTRAST
CT MAXILLOFACIAL WITHOUT CONTRAST
CT CERVICAL SPINE WITHOUT CONTRAST
TECHNIQUE: Multidetector CT imaging of the head, cervical spine, and
maxillofacial structures were performed using the standard protocol
without intravenous contrast. Multiplanar CT image reconstructions
of the cervical spine and maxillofacial structures were also
generated.

[Series 4: head bone · axial · 0.45mm/px · z∈[-99,-19]mm · 3 of 82 slices shown]
[im 21/82  bone]
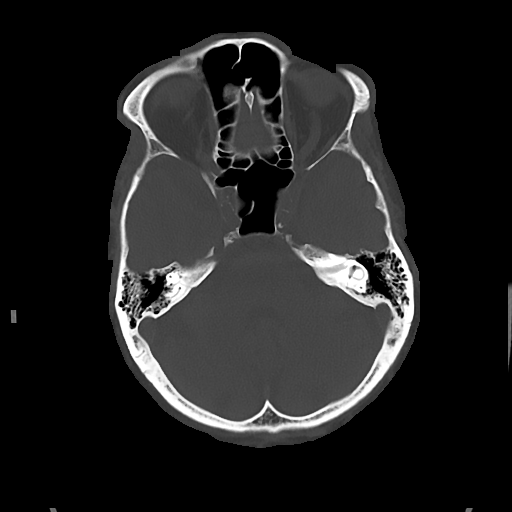
[im 41/82  bone]
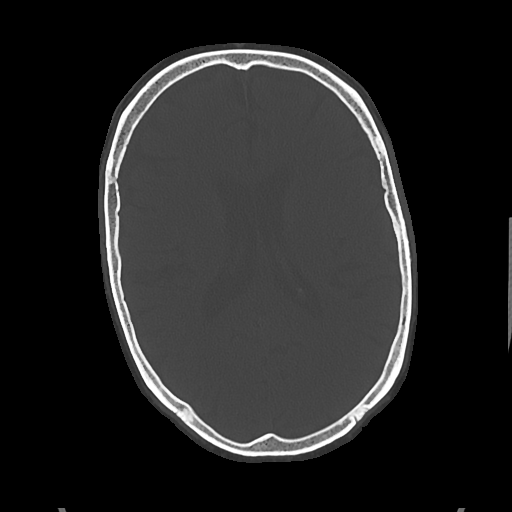
[im 61/82  bone]
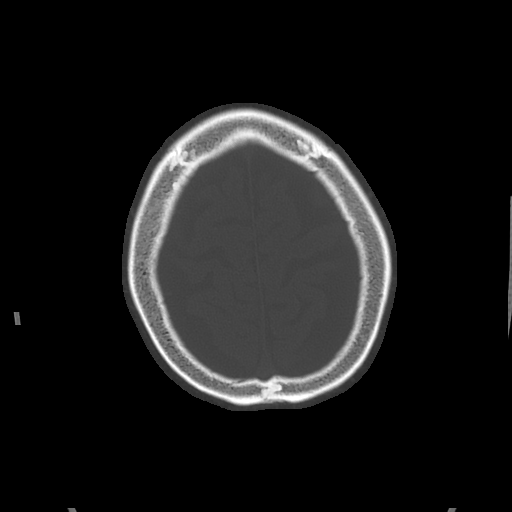

[Series 7: maxilllofacial 2.0 hr40 3 · axial · 0.36mm/px · z∈[-189,-97]mm · 3 of 93 slices shown]
[im 24/93  bone]
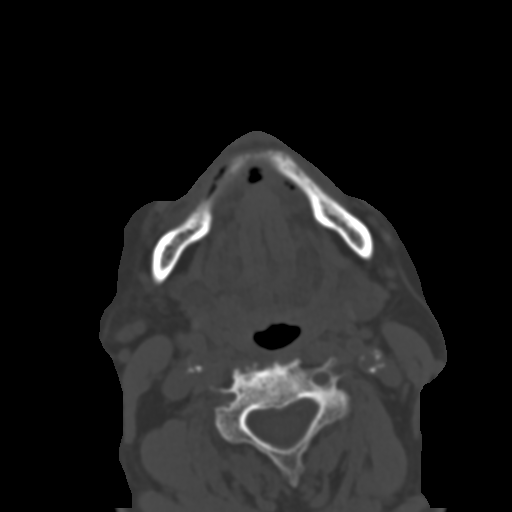
[im 47/93  bone]
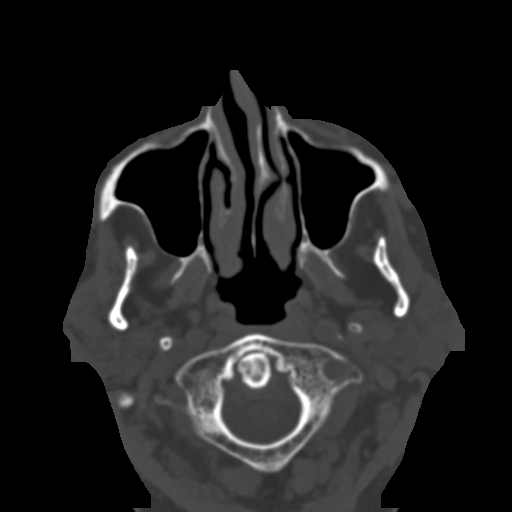
[im 70/93  bone]
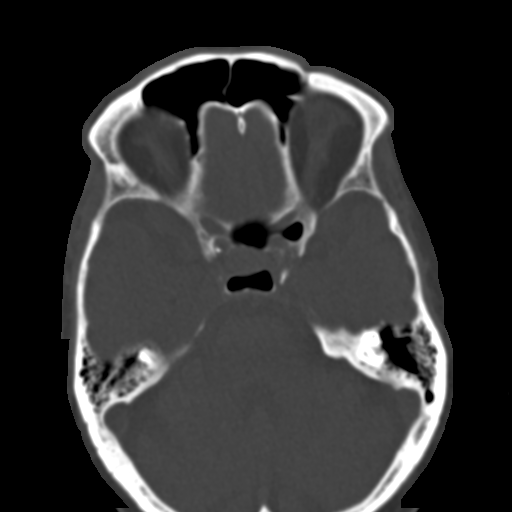

[Series 9: maxilllofacial 2.0 hr59 3 · axial · 0.36mm/px · z∈[-189,-97]mm · 3 of 93 slices shown]
[im 24/93  bone]
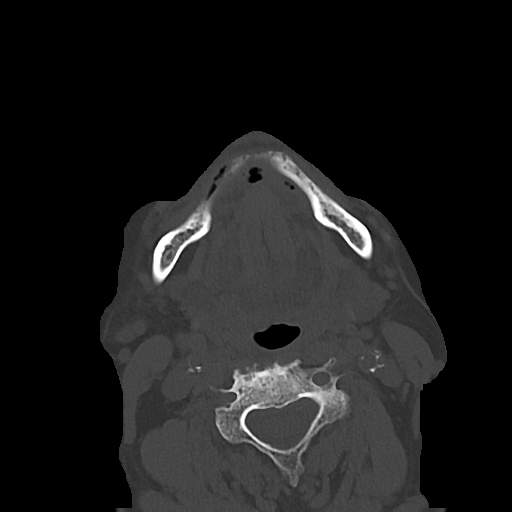
[im 47/93  bone]
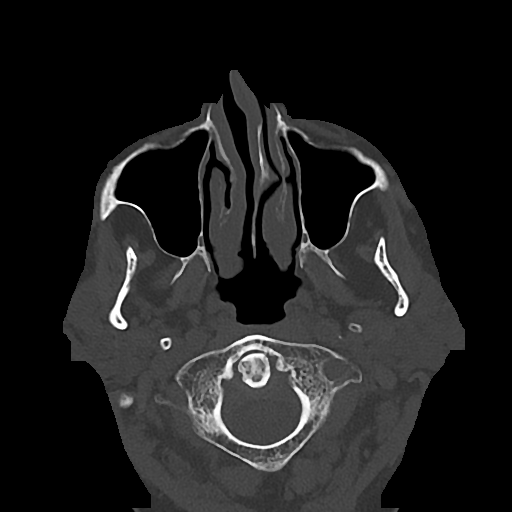
[im 70/93  bone]
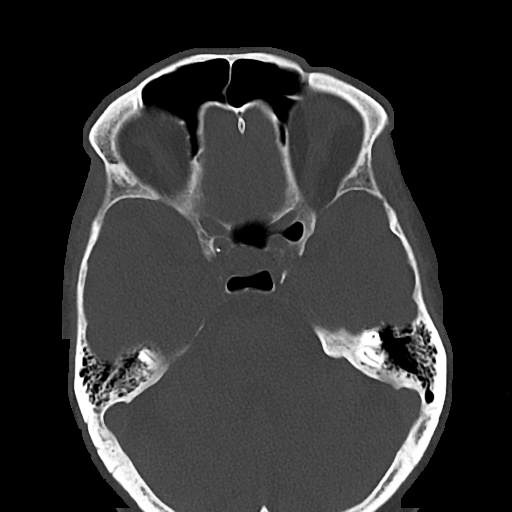

[Series 12: st sag · sagittal · 0.37mm/px · 1 of 102 slices shown]
[im 95/102  bone]
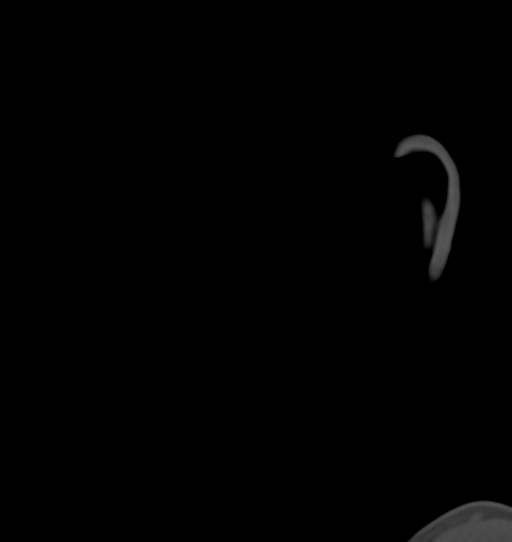

[Series 13: bone cor · coronal · 0.36mm/px · 1 of 92 slices shown]
[im 46/92  bone]
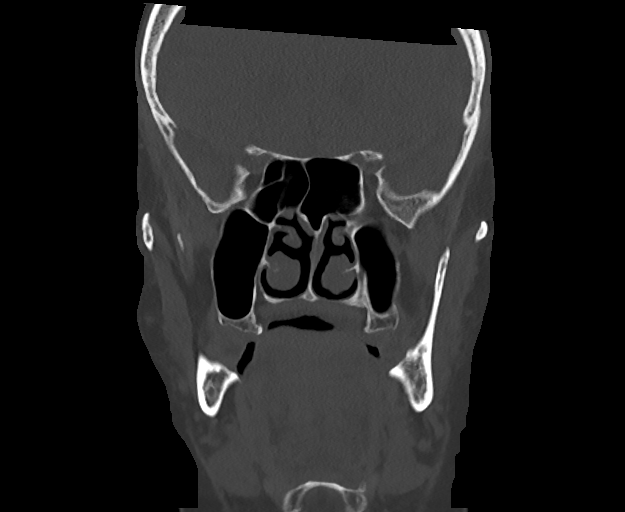

[Series 20: orthogonal axials · axial · 0.21mm/px · z∈[-288,-170]mm · 4 of 105 slices shown, 5 images]
[im 21/105  brain]
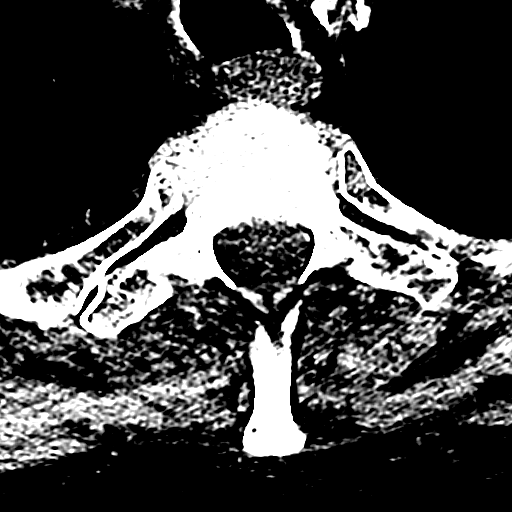
[im 21/105  bone]
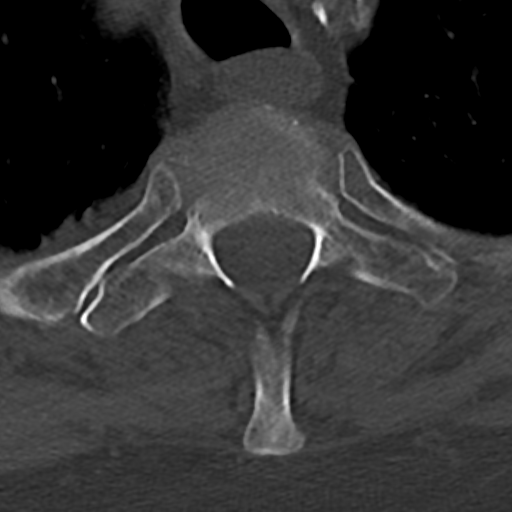
[im 42/105  bone]
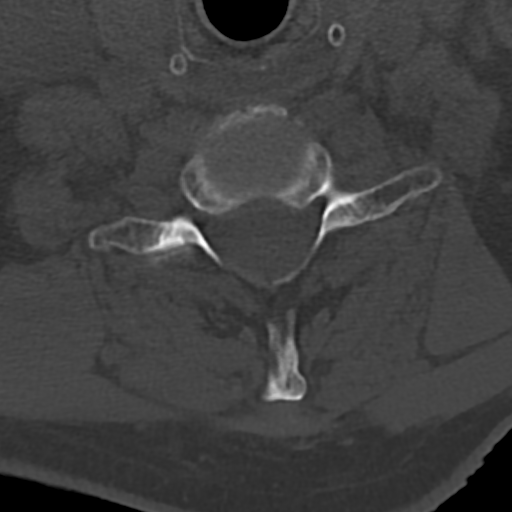
[im 63/105  bone]
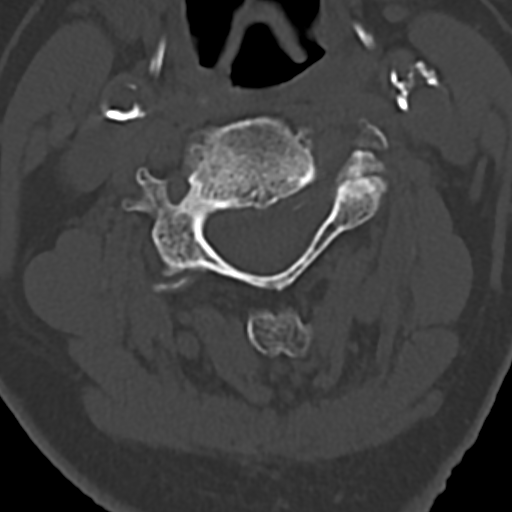
[im 84/105  bone]
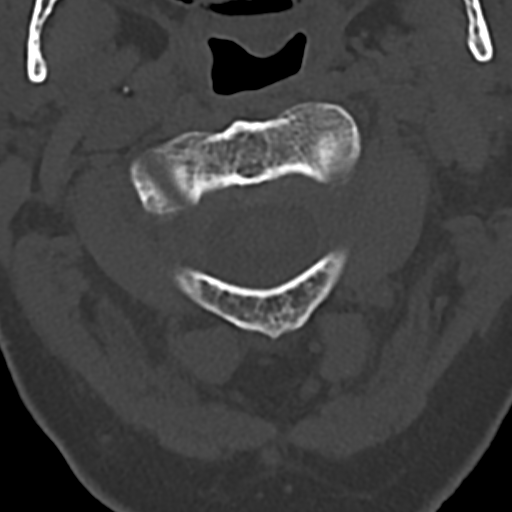

[15 of 47 positions shown; findings below may reference images not displayed]

FINDINGS: CT HEAD FINDINGS

Brain: Mildly enlarged ventricles and cortical sulci. Minimal patchy
white matter low density in both cerebral hemispheres. No
intracranial hemorrhage, mass lesion or CT evidence of acute
infarction.

Vascular: No hyperdense vessel or unexpected calcification.

Skull: Normal. Negative for fracture or focal lesion.

Other: None.

CT MAXILLOFACIAL FINDINGS

Osseous: No fracture or mandibular dislocation. No destructive
process.

Orbits: Status post bilateral cataract extraction. Otherwise, normal
appearing orbital contents.

Sinuses: Clear.

Soft tissues: Mild left lateral periorbital soft tissue swelling.

CT CERVICAL SPINE FINDINGS

Alignment: Mild retrolisthesis at the C3-4 C4-5 levels with
degenerative changes at those levels.

Skull base and vertebrae: No acute fracture. No primary bone lesion
or focal pathologic process.

Soft tissues and spinal canal: No prevertebral fluid or swelling. No
visible canal hematoma.

Disc levels: Degenerative changes at the C2-3 through C5-6 levels as
well as mild fragmented anterior spur formation at the C6-7 level.

Upper chest: Biapical pleural and parenchymal scarring.

Other: Bilateral dense carotid artery calcifications.
IMPRESSION: 1. No skull fracture or intracranial hemorrhage.
2. No maxillofacial fracture.
3. No cervical spine fracture or traumatic subluxation.
4. Mild diffuse cerebral and cerebellar atrophy.
5. Minimal chronic small vessel white matter ischemic changes in
both cerebral hemispheres.
6. Cervical spine degenerative changes.
7. Bilateral dense carotid artery atheromatous calcifications.

## 2020-01-04 DIAGNOSIS — N183 Chronic kidney disease, stage 3 unspecified: Secondary | ICD-10-CM | POA: Diagnosis not present

## 2020-01-04 DIAGNOSIS — D631 Anemia in chronic kidney disease: Secondary | ICD-10-CM | POA: Diagnosis not present

## 2020-01-10 DIAGNOSIS — Z1339 Encounter for screening examination for other mental health and behavioral disorders: Secondary | ICD-10-CM | POA: Diagnosis not present

## 2020-01-10 DIAGNOSIS — Z139 Encounter for screening, unspecified: Secondary | ICD-10-CM | POA: Diagnosis not present

## 2020-01-10 DIAGNOSIS — E538 Deficiency of other specified B group vitamins: Secondary | ICD-10-CM | POA: Diagnosis not present

## 2020-01-10 DIAGNOSIS — Z0000 Encounter for general adult medical examination without abnormal findings: Secondary | ICD-10-CM | POA: Diagnosis not present

## 2020-01-10 DIAGNOSIS — Z1331 Encounter for screening for depression: Secondary | ICD-10-CM | POA: Diagnosis not present

## 2020-01-25 DIAGNOSIS — D631 Anemia in chronic kidney disease: Secondary | ICD-10-CM | POA: Diagnosis not present

## 2020-01-25 DIAGNOSIS — N183 Chronic kidney disease, stage 3 unspecified: Secondary | ICD-10-CM | POA: Diagnosis not present

## 2020-01-31 DIAGNOSIS — J449 Chronic obstructive pulmonary disease, unspecified: Secondary | ICD-10-CM | POA: Diagnosis not present

## 2020-01-31 DIAGNOSIS — J301 Allergic rhinitis due to pollen: Secondary | ICD-10-CM | POA: Diagnosis not present

## 2020-02-09 DIAGNOSIS — E538 Deficiency of other specified B group vitamins: Secondary | ICD-10-CM | POA: Diagnosis not present

## 2020-02-15 DIAGNOSIS — Z8701 Personal history of pneumonia (recurrent): Secondary | ICD-10-CM | POA: Diagnosis not present

## 2020-02-15 DIAGNOSIS — I2510 Atherosclerotic heart disease of native coronary artery without angina pectoris: Secondary | ICD-10-CM | POA: Diagnosis not present

## 2020-02-15 DIAGNOSIS — J449 Chronic obstructive pulmonary disease, unspecified: Secondary | ICD-10-CM | POA: Diagnosis not present

## 2020-02-15 DIAGNOSIS — J432 Centrilobular emphysema: Secondary | ICD-10-CM | POA: Diagnosis not present

## 2020-02-15 DIAGNOSIS — I700 Atherosclerosis of aorta: Secondary | ICD-10-CM | POA: Diagnosis not present

## 2020-02-22 DIAGNOSIS — I129 Hypertensive chronic kidney disease with stage 1 through stage 4 chronic kidney disease, or unspecified chronic kidney disease: Secondary | ICD-10-CM | POA: Diagnosis not present

## 2020-02-22 DIAGNOSIS — D631 Anemia in chronic kidney disease: Secondary | ICD-10-CM | POA: Diagnosis not present

## 2020-02-22 DIAGNOSIS — E559 Vitamin D deficiency, unspecified: Secondary | ICD-10-CM | POA: Diagnosis not present

## 2020-02-22 DIAGNOSIS — N183 Chronic kidney disease, stage 3 unspecified: Secondary | ICD-10-CM | POA: Diagnosis not present

## 2020-03-07 DIAGNOSIS — N183 Chronic kidney disease, stage 3 unspecified: Secondary | ICD-10-CM | POA: Diagnosis not present

## 2020-03-07 DIAGNOSIS — D631 Anemia in chronic kidney disease: Secondary | ICD-10-CM | POA: Diagnosis not present

## 2020-03-11 DIAGNOSIS — E538 Deficiency of other specified B group vitamins: Secondary | ICD-10-CM | POA: Diagnosis not present

## 2020-03-28 DIAGNOSIS — D631 Anemia in chronic kidney disease: Secondary | ICD-10-CM | POA: Diagnosis not present

## 2020-03-28 DIAGNOSIS — N183 Chronic kidney disease, stage 3 unspecified: Secondary | ICD-10-CM | POA: Diagnosis not present

## 2020-04-01 DIAGNOSIS — E1169 Type 2 diabetes mellitus with other specified complication: Secondary | ICD-10-CM | POA: Diagnosis not present

## 2020-04-08 DIAGNOSIS — E538 Deficiency of other specified B group vitamins: Secondary | ICD-10-CM | POA: Diagnosis not present

## 2020-04-08 DIAGNOSIS — N1832 Chronic kidney disease, stage 3b: Secondary | ICD-10-CM | POA: Diagnosis not present

## 2020-04-08 DIAGNOSIS — I130 Hypertensive heart and chronic kidney disease with heart failure and stage 1 through stage 4 chronic kidney disease, or unspecified chronic kidney disease: Secondary | ICD-10-CM | POA: Diagnosis not present

## 2020-04-08 DIAGNOSIS — E1129 Type 2 diabetes mellitus with other diabetic kidney complication: Secondary | ICD-10-CM | POA: Diagnosis not present

## 2020-04-08 DIAGNOSIS — E1165 Type 2 diabetes mellitus with hyperglycemia: Secondary | ICD-10-CM | POA: Diagnosis not present

## 2020-04-10 DIAGNOSIS — R110 Nausea: Secondary | ICD-10-CM | POA: Diagnosis not present

## 2020-04-10 DIAGNOSIS — T50905A Adverse effect of unspecified drugs, medicaments and biological substances, initial encounter: Secondary | ICD-10-CM | POA: Diagnosis not present

## 2020-04-10 DIAGNOSIS — R1010 Upper abdominal pain, unspecified: Secondary | ICD-10-CM | POA: Diagnosis not present

## 2020-04-10 DIAGNOSIS — R9341 Abnormal radiologic findings on diagnostic imaging of renal pelvis, ureter, or bladder: Secondary | ICD-10-CM | POA: Diagnosis not present

## 2020-04-10 DIAGNOSIS — R112 Nausea with vomiting, unspecified: Secondary | ICD-10-CM | POA: Diagnosis not present

## 2020-04-10 DIAGNOSIS — R351 Nocturia: Secondary | ICD-10-CM | POA: Diagnosis not present

## 2020-04-10 DIAGNOSIS — N401 Enlarged prostate with lower urinary tract symptoms: Secondary | ICD-10-CM | POA: Diagnosis not present

## 2020-04-10 DIAGNOSIS — N179 Acute kidney failure, unspecified: Secondary | ICD-10-CM | POA: Diagnosis not present

## 2020-04-10 DIAGNOSIS — E860 Dehydration: Secondary | ICD-10-CM | POA: Diagnosis not present

## 2020-04-11 DIAGNOSIS — Z6825 Body mass index (BMI) 25.0-25.9, adult: Secondary | ICD-10-CM | POA: Diagnosis not present

## 2020-04-11 DIAGNOSIS — R112 Nausea with vomiting, unspecified: Secondary | ICD-10-CM | POA: Diagnosis not present

## 2020-04-12 DIAGNOSIS — E1129 Type 2 diabetes mellitus with other diabetic kidney complication: Secondary | ICD-10-CM | POA: Diagnosis not present

## 2020-04-17 DIAGNOSIS — E1165 Type 2 diabetes mellitus with hyperglycemia: Secondary | ICD-10-CM | POA: Diagnosis not present

## 2020-04-17 DIAGNOSIS — E1129 Type 2 diabetes mellitus with other diabetic kidney complication: Secondary | ICD-10-CM | POA: Diagnosis not present

## 2020-04-17 DIAGNOSIS — Z6825 Body mass index (BMI) 25.0-25.9, adult: Secondary | ICD-10-CM | POA: Diagnosis not present

## 2020-04-18 DIAGNOSIS — N183 Chronic kidney disease, stage 3 unspecified: Secondary | ICD-10-CM | POA: Diagnosis not present

## 2020-04-18 DIAGNOSIS — D631 Anemia in chronic kidney disease: Secondary | ICD-10-CM | POA: Diagnosis not present

## 2020-04-19 DIAGNOSIS — J301 Allergic rhinitis due to pollen: Secondary | ICD-10-CM | POA: Diagnosis not present

## 2020-04-19 DIAGNOSIS — J61 Pneumoconiosis due to asbestos and other mineral fibers: Secondary | ICD-10-CM | POA: Diagnosis not present

## 2020-04-19 DIAGNOSIS — J449 Chronic obstructive pulmonary disease, unspecified: Secondary | ICD-10-CM | POA: Diagnosis not present

## 2020-05-09 DIAGNOSIS — E538 Deficiency of other specified B group vitamins: Secondary | ICD-10-CM | POA: Diagnosis not present

## 2020-05-09 DIAGNOSIS — D631 Anemia in chronic kidney disease: Secondary | ICD-10-CM | POA: Diagnosis not present

## 2020-05-09 DIAGNOSIS — N183 Chronic kidney disease, stage 3 unspecified: Secondary | ICD-10-CM | POA: Diagnosis not present

## 2020-05-30 DIAGNOSIS — D631 Anemia in chronic kidney disease: Secondary | ICD-10-CM | POA: Diagnosis not present

## 2020-05-30 DIAGNOSIS — N183 Chronic kidney disease, stage 3 unspecified: Secondary | ICD-10-CM | POA: Diagnosis not present

## 2020-06-07 DIAGNOSIS — E538 Deficiency of other specified B group vitamins: Secondary | ICD-10-CM | POA: Diagnosis not present

## 2020-06-12 DIAGNOSIS — D631 Anemia in chronic kidney disease: Secondary | ICD-10-CM | POA: Diagnosis not present

## 2020-06-12 DIAGNOSIS — N189 Chronic kidney disease, unspecified: Secondary | ICD-10-CM | POA: Diagnosis not present

## 2020-06-19 DIAGNOSIS — N189 Chronic kidney disease, unspecified: Secondary | ICD-10-CM | POA: Diagnosis not present

## 2020-06-19 DIAGNOSIS — D631 Anemia in chronic kidney disease: Secondary | ICD-10-CM | POA: Diagnosis not present

## 2020-06-20 DIAGNOSIS — N183 Chronic kidney disease, stage 3 unspecified: Secondary | ICD-10-CM | POA: Diagnosis not present

## 2020-06-20 DIAGNOSIS — D631 Anemia in chronic kidney disease: Secondary | ICD-10-CM | POA: Diagnosis not present

## 2020-07-09 DIAGNOSIS — E538 Deficiency of other specified B group vitamins: Secondary | ICD-10-CM | POA: Diagnosis not present

## 2020-07-09 DIAGNOSIS — E1169 Type 2 diabetes mellitus with other specified complication: Secondary | ICD-10-CM | POA: Diagnosis not present

## 2020-07-11 DIAGNOSIS — N183 Chronic kidney disease, stage 3 unspecified: Secondary | ICD-10-CM | POA: Diagnosis not present

## 2020-07-11 DIAGNOSIS — D631 Anemia in chronic kidney disease: Secondary | ICD-10-CM | POA: Diagnosis not present

## 2020-07-19 DIAGNOSIS — I130 Hypertensive heart and chronic kidney disease with heart failure and stage 1 through stage 4 chronic kidney disease, or unspecified chronic kidney disease: Secondary | ICD-10-CM | POA: Diagnosis not present

## 2020-07-19 DIAGNOSIS — J449 Chronic obstructive pulmonary disease, unspecified: Secondary | ICD-10-CM | POA: Diagnosis not present

## 2020-07-19 DIAGNOSIS — E1129 Type 2 diabetes mellitus with other diabetic kidney complication: Secondary | ICD-10-CM | POA: Diagnosis not present

## 2020-07-19 DIAGNOSIS — N183 Chronic kidney disease, stage 3 unspecified: Secondary | ICD-10-CM | POA: Diagnosis not present

## 2020-08-01 DIAGNOSIS — N183 Chronic kidney disease, stage 3 unspecified: Secondary | ICD-10-CM | POA: Diagnosis not present

## 2020-08-01 DIAGNOSIS — D631 Anemia in chronic kidney disease: Secondary | ICD-10-CM | POA: Diagnosis not present

## 2020-08-08 DIAGNOSIS — E538 Deficiency of other specified B group vitamins: Secondary | ICD-10-CM | POA: Diagnosis not present

## 2020-08-20 DIAGNOSIS — J449 Chronic obstructive pulmonary disease, unspecified: Secondary | ICD-10-CM | POA: Diagnosis not present

## 2020-08-20 DIAGNOSIS — J61 Pneumoconiosis due to asbestos and other mineral fibers: Secondary | ICD-10-CM | POA: Diagnosis not present

## 2020-08-20 DIAGNOSIS — J301 Allergic rhinitis due to pollen: Secondary | ICD-10-CM | POA: Diagnosis not present

## 2020-08-22 DIAGNOSIS — D631 Anemia in chronic kidney disease: Secondary | ICD-10-CM | POA: Diagnosis not present

## 2020-08-22 DIAGNOSIS — N183 Chronic kidney disease, stage 3 unspecified: Secondary | ICD-10-CM | POA: Diagnosis not present

## 2020-09-10 DIAGNOSIS — E538 Deficiency of other specified B group vitamins: Secondary | ICD-10-CM | POA: Diagnosis not present

## 2020-09-12 DIAGNOSIS — D631 Anemia in chronic kidney disease: Secondary | ICD-10-CM | POA: Diagnosis not present

## 2020-09-12 DIAGNOSIS — N183 Chronic kidney disease, stage 3 unspecified: Secondary | ICD-10-CM | POA: Diagnosis not present

## 2020-10-03 DIAGNOSIS — D631 Anemia in chronic kidney disease: Secondary | ICD-10-CM | POA: Diagnosis not present

## 2020-10-03 DIAGNOSIS — N183 Chronic kidney disease, stage 3 unspecified: Secondary | ICD-10-CM | POA: Diagnosis not present

## 2020-10-08 DIAGNOSIS — R972 Elevated prostate specific antigen [PSA]: Secondary | ICD-10-CM | POA: Diagnosis not present

## 2020-10-08 DIAGNOSIS — R351 Nocturia: Secondary | ICD-10-CM | POA: Diagnosis not present

## 2020-10-08 DIAGNOSIS — N401 Enlarged prostate with lower urinary tract symptoms: Secondary | ICD-10-CM | POA: Diagnosis not present

## 2020-10-10 DIAGNOSIS — E538 Deficiency of other specified B group vitamins: Secondary | ICD-10-CM | POA: Diagnosis not present

## 2020-10-17 DIAGNOSIS — Z23 Encounter for immunization: Secondary | ICD-10-CM | POA: Diagnosis not present

## 2020-10-24 DIAGNOSIS — N183 Chronic kidney disease, stage 3 unspecified: Secondary | ICD-10-CM | POA: Diagnosis not present

## 2020-10-24 DIAGNOSIS — D631 Anemia in chronic kidney disease: Secondary | ICD-10-CM | POA: Diagnosis not present

## 2020-11-07 DIAGNOSIS — E538 Deficiency of other specified B group vitamins: Secondary | ICD-10-CM | POA: Diagnosis not present

## 2020-11-14 DIAGNOSIS — D631 Anemia in chronic kidney disease: Secondary | ICD-10-CM | POA: Diagnosis not present

## 2020-11-14 DIAGNOSIS — N183 Chronic kidney disease, stage 3 unspecified: Secondary | ICD-10-CM | POA: Diagnosis not present

## 2020-11-15 DIAGNOSIS — E1169 Type 2 diabetes mellitus with other specified complication: Secondary | ICD-10-CM | POA: Diagnosis not present

## 2020-11-19 DIAGNOSIS — J449 Chronic obstructive pulmonary disease, unspecified: Secondary | ICD-10-CM | POA: Diagnosis not present

## 2020-11-19 DIAGNOSIS — J301 Allergic rhinitis due to pollen: Secondary | ICD-10-CM | POA: Diagnosis not present

## 2020-11-19 DIAGNOSIS — J61 Pneumoconiosis due to asbestos and other mineral fibers: Secondary | ICD-10-CM | POA: Diagnosis not present

## 2020-11-22 DIAGNOSIS — N1832 Chronic kidney disease, stage 3b: Secondary | ICD-10-CM | POA: Diagnosis not present

## 2020-11-22 DIAGNOSIS — E1129 Type 2 diabetes mellitus with other diabetic kidney complication: Secondary | ICD-10-CM | POA: Diagnosis not present

## 2020-11-22 DIAGNOSIS — E785 Hyperlipidemia, unspecified: Secondary | ICD-10-CM | POA: Diagnosis not present

## 2020-11-22 DIAGNOSIS — E1169 Type 2 diabetes mellitus with other specified complication: Secondary | ICD-10-CM | POA: Diagnosis not present

## 2020-11-27 DIAGNOSIS — I129 Hypertensive chronic kidney disease with stage 1 through stage 4 chronic kidney disease, or unspecified chronic kidney disease: Secondary | ICD-10-CM | POA: Diagnosis not present

## 2020-11-27 DIAGNOSIS — E559 Vitamin D deficiency, unspecified: Secondary | ICD-10-CM | POA: Diagnosis not present

## 2020-11-27 DIAGNOSIS — N183 Chronic kidney disease, stage 3 unspecified: Secondary | ICD-10-CM | POA: Diagnosis not present

## 2020-11-27 DIAGNOSIS — D631 Anemia in chronic kidney disease: Secondary | ICD-10-CM | POA: Diagnosis not present

## 2020-12-04 DIAGNOSIS — N183 Chronic kidney disease, stage 3 unspecified: Secondary | ICD-10-CM | POA: Diagnosis not present

## 2020-12-04 DIAGNOSIS — D631 Anemia in chronic kidney disease: Secondary | ICD-10-CM | POA: Diagnosis not present

## 2020-12-09 DIAGNOSIS — E538 Deficiency of other specified B group vitamins: Secondary | ICD-10-CM | POA: Diagnosis not present

## 2020-12-19 DIAGNOSIS — Z23 Encounter for immunization: Secondary | ICD-10-CM | POA: Diagnosis not present

## 2020-12-26 DIAGNOSIS — N183 Chronic kidney disease, stage 3 unspecified: Secondary | ICD-10-CM | POA: Diagnosis not present

## 2020-12-26 DIAGNOSIS — D631 Anemia in chronic kidney disease: Secondary | ICD-10-CM | POA: Diagnosis not present

## 2021-01-08 DIAGNOSIS — E538 Deficiency of other specified B group vitamins: Secondary | ICD-10-CM | POA: Diagnosis not present

## 2021-01-16 DIAGNOSIS — D631 Anemia in chronic kidney disease: Secondary | ICD-10-CM | POA: Diagnosis not present

## 2021-01-16 DIAGNOSIS — N183 Chronic kidney disease, stage 3 unspecified: Secondary | ICD-10-CM | POA: Diagnosis not present

## 2021-02-06 DIAGNOSIS — N185 Chronic kidney disease, stage 5: Secondary | ICD-10-CM | POA: Diagnosis not present

## 2021-02-06 DIAGNOSIS — D631 Anemia in chronic kidney disease: Secondary | ICD-10-CM | POA: Diagnosis not present

## 2021-02-10 DIAGNOSIS — E538 Deficiency of other specified B group vitamins: Secondary | ICD-10-CM | POA: Diagnosis not present

## 2021-02-27 DIAGNOSIS — D631 Anemia in chronic kidney disease: Secondary | ICD-10-CM | POA: Diagnosis not present

## 2021-02-27 DIAGNOSIS — N183 Chronic kidney disease, stage 3 unspecified: Secondary | ICD-10-CM | POA: Diagnosis not present

## 2021-03-11 DIAGNOSIS — E538 Deficiency of other specified B group vitamins: Secondary | ICD-10-CM | POA: Diagnosis not present

## 2021-03-17 DIAGNOSIS — E1169 Type 2 diabetes mellitus with other specified complication: Secondary | ICD-10-CM | POA: Diagnosis not present

## 2021-03-18 DIAGNOSIS — J61 Pneumoconiosis due to asbestos and other mineral fibers: Secondary | ICD-10-CM | POA: Diagnosis not present

## 2021-03-18 DIAGNOSIS — J449 Chronic obstructive pulmonary disease, unspecified: Secondary | ICD-10-CM | POA: Diagnosis not present

## 2021-03-18 DIAGNOSIS — J301 Allergic rhinitis due to pollen: Secondary | ICD-10-CM | POA: Diagnosis not present

## 2021-03-20 DIAGNOSIS — N183 Chronic kidney disease, stage 3 unspecified: Secondary | ICD-10-CM | POA: Diagnosis not present

## 2021-03-20 DIAGNOSIS — D631 Anemia in chronic kidney disease: Secondary | ICD-10-CM | POA: Diagnosis not present

## 2021-03-24 DIAGNOSIS — Z0000 Encounter for general adult medical examination without abnormal findings: Secondary | ICD-10-CM | POA: Diagnosis not present

## 2021-03-24 DIAGNOSIS — Z1331 Encounter for screening for depression: Secondary | ICD-10-CM | POA: Diagnosis not present

## 2021-03-24 DIAGNOSIS — Z139 Encounter for screening, unspecified: Secondary | ICD-10-CM | POA: Diagnosis not present

## 2021-03-24 DIAGNOSIS — I152 Hypertension secondary to endocrine disorders: Secondary | ICD-10-CM | POA: Diagnosis not present

## 2021-03-24 DIAGNOSIS — Z136 Encounter for screening for cardiovascular disorders: Secondary | ICD-10-CM | POA: Diagnosis not present

## 2021-03-24 DIAGNOSIS — Z1339 Encounter for screening examination for other mental health and behavioral disorders: Secondary | ICD-10-CM | POA: Diagnosis not present

## 2021-03-24 DIAGNOSIS — N1832 Chronic kidney disease, stage 3b: Secondary | ICD-10-CM | POA: Diagnosis not present

## 2021-03-24 DIAGNOSIS — E1169 Type 2 diabetes mellitus with other specified complication: Secondary | ICD-10-CM | POA: Diagnosis not present

## 2021-03-24 DIAGNOSIS — E1159 Type 2 diabetes mellitus with other circulatory complications: Secondary | ICD-10-CM | POA: Diagnosis not present

## 2021-04-08 DIAGNOSIS — E538 Deficiency of other specified B group vitamins: Secondary | ICD-10-CM | POA: Diagnosis not present

## 2021-04-09 DIAGNOSIS — R972 Elevated prostate specific antigen [PSA]: Secondary | ICD-10-CM | POA: Diagnosis not present

## 2021-04-09 DIAGNOSIS — N401 Enlarged prostate with lower urinary tract symptoms: Secondary | ICD-10-CM | POA: Diagnosis not present

## 2021-04-09 DIAGNOSIS — R351 Nocturia: Secondary | ICD-10-CM | POA: Diagnosis not present

## 2021-04-10 DIAGNOSIS — D631 Anemia in chronic kidney disease: Secondary | ICD-10-CM | POA: Diagnosis not present

## 2021-04-10 DIAGNOSIS — N183 Chronic kidney disease, stage 3 unspecified: Secondary | ICD-10-CM | POA: Diagnosis not present

## 2021-04-15 NOTE — Patient Outreach (Signed)
[facility]) Care Management ? ?04/15/2021 ? ?Terry Vasquez ?[birth year] ?[id number] ? ? ?Received referral for Care Management from Insurance plan. Assigned patient to Raina Mina, RN Care Coordinator for follow up. ? ? ?[name] ?[place] Management Assistant ?[phone] ? ?

## 2021-04-22 ENCOUNTER — Other Ambulatory Visit: Payer: Self-pay | Admitting: *Deleted

## 2021-04-22 NOTE — Patient Outreach (Signed)
[facility]) Care Management ? ?04/22/2021 ? ?Noreene Filbert Panning ?06-Jun-1940 ?[id number] ? ? ?Telephone Assessment-Successful ? ?RN spoke with pt today and introduced [facility] services and potential program that pt would benefit from with case management services. Pt recited all providers addressing his ongoing issues related to COPD/anemia and HTN., Reports all are managed well with no acute needs at this time. Pt has opt to decline case management services and states he does not need any services at this time. Pt however was receptive to information packet sent via mail concerning [facility] services. ? ?Will send letter to provider concerning pt's decline for [facility] services at this time and close this case. ? ?Raina Mina, RN ?Care Management Coordinator ?[place] ?Main Office [phone]  ?

## 2021-05-01 DIAGNOSIS — N183 Chronic kidney disease, stage 3 unspecified: Secondary | ICD-10-CM | POA: Diagnosis not present

## 2021-05-01 DIAGNOSIS — D631 Anemia in chronic kidney disease: Secondary | ICD-10-CM | POA: Diagnosis not present

## 2021-05-06 DIAGNOSIS — E538 Deficiency of other specified B group vitamins: Secondary | ICD-10-CM | POA: Diagnosis not present

## 2021-05-22 DIAGNOSIS — N183 Chronic kidney disease, stage 3 unspecified: Secondary | ICD-10-CM | POA: Diagnosis not present

## 2021-05-22 DIAGNOSIS — D631 Anemia in chronic kidney disease: Secondary | ICD-10-CM | POA: Diagnosis not present

## 2021-06-10 DIAGNOSIS — E538 Deficiency of other specified B group vitamins: Secondary | ICD-10-CM | POA: Diagnosis not present

## 2021-06-12 DIAGNOSIS — D631 Anemia in chronic kidney disease: Secondary | ICD-10-CM | POA: Diagnosis not present

## 2021-06-12 DIAGNOSIS — N183 Chronic kidney disease, stage 3 unspecified: Secondary | ICD-10-CM | POA: Diagnosis not present

## 2021-06-17 DIAGNOSIS — J301 Allergic rhinitis due to pollen: Secondary | ICD-10-CM | POA: Diagnosis not present

## 2021-06-17 DIAGNOSIS — J449 Chronic obstructive pulmonary disease, unspecified: Secondary | ICD-10-CM | POA: Diagnosis not present

## 2021-06-17 DIAGNOSIS — J61 Pneumoconiosis due to asbestos and other mineral fibers: Secondary | ICD-10-CM | POA: Diagnosis not present

## 2021-07-03 DIAGNOSIS — N183 Chronic kidney disease, stage 3 unspecified: Secondary | ICD-10-CM | POA: Diagnosis not present

## 2021-07-03 DIAGNOSIS — D631 Anemia in chronic kidney disease: Secondary | ICD-10-CM | POA: Diagnosis not present

## 2021-07-11 DIAGNOSIS — E538 Deficiency of other specified B group vitamins: Secondary | ICD-10-CM | POA: Diagnosis not present

## 2021-07-17 DIAGNOSIS — J432 Centrilobular emphysema: Secondary | ICD-10-CM | POA: Diagnosis not present

## 2021-07-17 DIAGNOSIS — R0600 Dyspnea, unspecified: Secondary | ICD-10-CM | POA: Diagnosis not present

## 2021-07-21 DIAGNOSIS — E1169 Type 2 diabetes mellitus with other specified complication: Secondary | ICD-10-CM | POA: Diagnosis not present

## 2021-07-24 DIAGNOSIS — D631 Anemia in chronic kidney disease: Secondary | ICD-10-CM | POA: Diagnosis not present

## 2021-07-24 DIAGNOSIS — N183 Chronic kidney disease, stage 3 unspecified: Secondary | ICD-10-CM | POA: Diagnosis not present

## 2021-07-28 DIAGNOSIS — Z6826 Body mass index (BMI) 26.0-26.9, adult: Secondary | ICD-10-CM | POA: Diagnosis not present

## 2021-07-28 DIAGNOSIS — E785 Hyperlipidemia, unspecified: Secondary | ICD-10-CM | POA: Diagnosis not present

## 2021-07-28 DIAGNOSIS — E1159 Type 2 diabetes mellitus with other circulatory complications: Secondary | ICD-10-CM | POA: Diagnosis not present

## 2021-07-28 DIAGNOSIS — E1169 Type 2 diabetes mellitus with other specified complication: Secondary | ICD-10-CM | POA: Diagnosis not present

## 2021-07-28 DIAGNOSIS — E1129 Type 2 diabetes mellitus with other diabetic kidney complication: Secondary | ICD-10-CM | POA: Diagnosis not present

## 2021-08-08 DIAGNOSIS — E538 Deficiency of other specified B group vitamins: Secondary | ICD-10-CM | POA: Diagnosis not present

## 2021-08-11 DIAGNOSIS — J61 Pneumoconiosis due to asbestos and other mineral fibers: Secondary | ICD-10-CM | POA: Diagnosis not present

## 2021-08-11 DIAGNOSIS — J301 Allergic rhinitis due to pollen: Secondary | ICD-10-CM | POA: Diagnosis not present

## 2021-08-11 DIAGNOSIS — J449 Chronic obstructive pulmonary disease, unspecified: Secondary | ICD-10-CM | POA: Diagnosis not present

## 2021-08-14 DIAGNOSIS — N183 Chronic kidney disease, stage 3 unspecified: Secondary | ICD-10-CM | POA: Diagnosis not present

## 2021-08-14 DIAGNOSIS — D631 Anemia in chronic kidney disease: Secondary | ICD-10-CM | POA: Diagnosis not present

## 2021-09-05 DIAGNOSIS — R079 Chest pain, unspecified: Secondary | ICD-10-CM | POA: Diagnosis not present

## 2021-09-05 DIAGNOSIS — S72115A Nondisplaced fracture of greater trochanter of left femur, initial encounter for closed fracture: Secondary | ICD-10-CM | POA: Diagnosis not present

## 2021-09-05 DIAGNOSIS — M1990 Unspecified osteoarthritis, unspecified site: Secondary | ICD-10-CM | POA: Diagnosis not present

## 2021-09-05 DIAGNOSIS — Z741 Need for assistance with personal care: Secondary | ICD-10-CM | POA: Diagnosis not present

## 2021-09-05 DIAGNOSIS — N401 Enlarged prostate with lower urinary tract symptoms: Secondary | ICD-10-CM | POA: Diagnosis not present

## 2021-09-05 DIAGNOSIS — I129 Hypertensive chronic kidney disease with stage 1 through stage 4 chronic kidney disease, or unspecified chronic kidney disease: Secondary | ICD-10-CM | POA: Diagnosis not present

## 2021-09-05 DIAGNOSIS — J61 Pneumoconiosis due to asbestos and other mineral fibers: Secondary | ICD-10-CM | POA: Diagnosis not present

## 2021-09-05 DIAGNOSIS — W19XXXA Unspecified fall, initial encounter: Secondary | ICD-10-CM | POA: Diagnosis not present

## 2021-09-05 DIAGNOSIS — S0990XA Unspecified injury of head, initial encounter: Secondary | ICD-10-CM | POA: Diagnosis not present

## 2021-09-05 DIAGNOSIS — M4312 Spondylolisthesis, cervical region: Secondary | ICD-10-CM | POA: Diagnosis not present

## 2021-09-05 DIAGNOSIS — N183 Chronic kidney disease, stage 3 unspecified: Secondary | ICD-10-CM | POA: Diagnosis not present

## 2021-09-05 DIAGNOSIS — E7800 Pure hypercholesterolemia, unspecified: Secondary | ICD-10-CM | POA: Diagnosis not present

## 2021-09-05 DIAGNOSIS — M25552 Pain in left hip: Secondary | ICD-10-CM | POA: Diagnosis not present

## 2021-09-05 DIAGNOSIS — I10 Essential (primary) hypertension: Secondary | ICD-10-CM | POA: Diagnosis not present

## 2021-09-05 DIAGNOSIS — E785 Hyperlipidemia, unspecified: Secondary | ICD-10-CM | POA: Diagnosis not present

## 2021-09-05 DIAGNOSIS — Z7984 Long term (current) use of oral hypoglycemic drugs: Secondary | ICD-10-CM | POA: Diagnosis not present

## 2021-09-05 DIAGNOSIS — Z7902 Long term (current) use of antithrombotics/antiplatelets: Secondary | ICD-10-CM | POA: Diagnosis not present

## 2021-09-05 DIAGNOSIS — S72142D Displaced intertrochanteric fracture of left femur, subsequent encounter for closed fracture with routine healing: Secondary | ICD-10-CM | POA: Diagnosis not present

## 2021-09-05 DIAGNOSIS — M25512 Pain in left shoulder: Secondary | ICD-10-CM | POA: Diagnosis not present

## 2021-09-05 DIAGNOSIS — K4090 Unilateral inguinal hernia, without obstruction or gangrene, not specified as recurrent: Secondary | ICD-10-CM | POA: Diagnosis not present

## 2021-09-05 DIAGNOSIS — I6523 Occlusion and stenosis of bilateral carotid arteries: Secondary | ICD-10-CM | POA: Diagnosis not present

## 2021-09-05 DIAGNOSIS — Z79899 Other long term (current) drug therapy: Secondary | ICD-10-CM | POA: Diagnosis not present

## 2021-09-05 DIAGNOSIS — J948 Other specified pleural conditions: Secondary | ICD-10-CM | POA: Diagnosis not present

## 2021-09-05 DIAGNOSIS — Z7401 Bed confinement status: Secondary | ICD-10-CM | POA: Diagnosis not present

## 2021-09-05 DIAGNOSIS — R52 Pain, unspecified: Secondary | ICD-10-CM | POA: Diagnosis not present

## 2021-09-05 DIAGNOSIS — J8410 Pulmonary fibrosis, unspecified: Secondary | ICD-10-CM | POA: Diagnosis not present

## 2021-09-05 DIAGNOSIS — J449 Chronic obstructive pulmonary disease, unspecified: Secondary | ICD-10-CM | POA: Diagnosis not present

## 2021-09-05 DIAGNOSIS — Z91041 Radiographic dye allergy status: Secondary | ICD-10-CM | POA: Diagnosis not present

## 2021-09-05 DIAGNOSIS — R0902 Hypoxemia: Secondary | ICD-10-CM | POA: Diagnosis not present

## 2021-09-05 DIAGNOSIS — S72002A Fracture of unspecified part of neck of left femur, initial encounter for closed fracture: Secondary | ICD-10-CM | POA: Diagnosis not present

## 2021-09-05 DIAGNOSIS — E875 Hyperkalemia: Secondary | ICD-10-CM | POA: Diagnosis not present

## 2021-09-05 DIAGNOSIS — Z87891 Personal history of nicotine dependence: Secondary | ICD-10-CM | POA: Diagnosis not present

## 2021-09-05 DIAGNOSIS — Z886 Allergy status to analgesic agent status: Secondary | ICD-10-CM | POA: Diagnosis not present

## 2021-09-05 DIAGNOSIS — S72142A Displaced intertrochanteric fracture of left femur, initial encounter for closed fracture: Secondary | ICD-10-CM | POA: Diagnosis not present

## 2021-09-05 DIAGNOSIS — S3993XA Unspecified injury of pelvis, initial encounter: Secondary | ICD-10-CM | POA: Diagnosis not present

## 2021-09-05 DIAGNOSIS — D649 Anemia, unspecified: Secondary | ICD-10-CM | POA: Diagnosis not present

## 2021-09-05 DIAGNOSIS — R2681 Unsteadiness on feet: Secondary | ICD-10-CM | POA: Diagnosis not present

## 2021-09-05 DIAGNOSIS — R488 Other symbolic dysfunctions: Secondary | ICD-10-CM | POA: Diagnosis not present

## 2021-09-05 DIAGNOSIS — D631 Anemia in chronic kidney disease: Secondary | ICD-10-CM | POA: Diagnosis not present

## 2021-09-05 DIAGNOSIS — Z8701 Personal history of pneumonia (recurrent): Secondary | ICD-10-CM | POA: Diagnosis not present

## 2021-09-05 DIAGNOSIS — S199XXA Unspecified injury of neck, initial encounter: Secondary | ICD-10-CM | POA: Diagnosis not present

## 2021-09-05 DIAGNOSIS — M810 Age-related osteoporosis without current pathological fracture: Secondary | ICD-10-CM | POA: Diagnosis not present

## 2021-09-05 DIAGNOSIS — I959 Hypotension, unspecified: Secondary | ICD-10-CM | POA: Diagnosis not present

## 2021-09-05 DIAGNOSIS — M2578 Osteophyte, vertebrae: Secondary | ICD-10-CM | POA: Diagnosis not present

## 2021-09-05 DIAGNOSIS — E119 Type 2 diabetes mellitus without complications: Secondary | ICD-10-CM | POA: Diagnosis not present

## 2021-09-05 DIAGNOSIS — S99912A Unspecified injury of left ankle, initial encounter: Secondary | ICD-10-CM | POA: Diagnosis not present

## 2021-09-05 DIAGNOSIS — G319 Degenerative disease of nervous system, unspecified: Secondary | ICD-10-CM | POA: Diagnosis not present

## 2021-09-05 DIAGNOSIS — E1122 Type 2 diabetes mellitus with diabetic chronic kidney disease: Secondary | ICD-10-CM | POA: Diagnosis not present

## 2021-09-05 DIAGNOSIS — Z888 Allergy status to other drugs, medicaments and biological substances status: Secondary | ICD-10-CM | POA: Diagnosis not present

## 2021-09-05 DIAGNOSIS — Z7409 Other reduced mobility: Secondary | ICD-10-CM | POA: Diagnosis not present

## 2021-09-05 DIAGNOSIS — M6281 Muscle weakness (generalized): Secondary | ICD-10-CM | POA: Diagnosis not present

## 2021-09-05 DIAGNOSIS — R339 Retention of urine, unspecified: Secondary | ICD-10-CM | POA: Diagnosis not present

## 2021-09-05 DIAGNOSIS — K429 Umbilical hernia without obstruction or gangrene: Secondary | ICD-10-CM | POA: Diagnosis not present

## 2021-09-05 DIAGNOSIS — N179 Acute kidney failure, unspecified: Secondary | ICD-10-CM | POA: Diagnosis not present

## 2021-09-06 DIAGNOSIS — N179 Acute kidney failure, unspecified: Secondary | ICD-10-CM | POA: Diagnosis not present

## 2021-09-06 DIAGNOSIS — I129 Hypertensive chronic kidney disease with stage 1 through stage 4 chronic kidney disease, or unspecified chronic kidney disease: Secondary | ICD-10-CM | POA: Diagnosis not present

## 2021-09-06 DIAGNOSIS — W19XXXA Unspecified fall, initial encounter: Secondary | ICD-10-CM | POA: Diagnosis not present

## 2021-09-06 DIAGNOSIS — R0902 Hypoxemia: Secondary | ICD-10-CM | POA: Diagnosis not present

## 2021-09-06 DIAGNOSIS — S72142A Displaced intertrochanteric fracture of left femur, initial encounter for closed fracture: Secondary | ICD-10-CM | POA: Diagnosis not present

## 2021-09-06 DIAGNOSIS — I10 Essential (primary) hypertension: Secondary | ICD-10-CM | POA: Diagnosis not present

## 2021-09-06 DIAGNOSIS — S72002A Fracture of unspecified part of neck of left femur, initial encounter for closed fracture: Secondary | ICD-10-CM | POA: Diagnosis not present

## 2021-09-06 DIAGNOSIS — E119 Type 2 diabetes mellitus without complications: Secondary | ICD-10-CM | POA: Diagnosis not present

## 2021-09-07 DIAGNOSIS — N179 Acute kidney failure, unspecified: Secondary | ICD-10-CM | POA: Diagnosis not present

## 2021-09-07 DIAGNOSIS — I129 Hypertensive chronic kidney disease with stage 1 through stage 4 chronic kidney disease, or unspecified chronic kidney disease: Secondary | ICD-10-CM | POA: Diagnosis not present

## 2021-09-07 DIAGNOSIS — S72142A Displaced intertrochanteric fracture of left femur, initial encounter for closed fracture: Secondary | ICD-10-CM | POA: Diagnosis not present

## 2021-09-08 DIAGNOSIS — I129 Hypertensive chronic kidney disease with stage 1 through stage 4 chronic kidney disease, or unspecified chronic kidney disease: Secondary | ICD-10-CM | POA: Diagnosis not present

## 2021-09-08 DIAGNOSIS — S72142A Displaced intertrochanteric fracture of left femur, initial encounter for closed fracture: Secondary | ICD-10-CM | POA: Diagnosis not present

## 2021-09-08 DIAGNOSIS — N179 Acute kidney failure, unspecified: Secondary | ICD-10-CM | POA: Diagnosis not present

## 2021-09-09 DIAGNOSIS — I130 Hypertensive heart and chronic kidney disease with heart failure and stage 1 through stage 4 chronic kidney disease, or unspecified chronic kidney disease: Secondary | ICD-10-CM | POA: Diagnosis not present

## 2021-09-09 DIAGNOSIS — D649 Anemia, unspecified: Secondary | ICD-10-CM | POA: Diagnosis not present

## 2021-09-09 DIAGNOSIS — R488 Other symbolic dysfunctions: Secondary | ICD-10-CM | POA: Diagnosis not present

## 2021-09-09 DIAGNOSIS — S72142D Displaced intertrochanteric fracture of left femur, subsequent encounter for closed fracture with routine healing: Secondary | ICD-10-CM | POA: Diagnosis not present

## 2021-09-09 DIAGNOSIS — Z79899 Other long term (current) drug therapy: Secondary | ICD-10-CM | POA: Diagnosis not present

## 2021-09-09 DIAGNOSIS — Z888 Allergy status to other drugs, medicaments and biological substances status: Secondary | ICD-10-CM | POA: Diagnosis not present

## 2021-09-09 DIAGNOSIS — N179 Acute kidney failure, unspecified: Secondary | ICD-10-CM | POA: Diagnosis not present

## 2021-09-09 DIAGNOSIS — Z7401 Bed confinement status: Secondary | ICD-10-CM | POA: Diagnosis not present

## 2021-09-09 DIAGNOSIS — E7800 Pure hypercholesterolemia, unspecified: Secondary | ICD-10-CM | POA: Diagnosis not present

## 2021-09-09 DIAGNOSIS — S72142A Displaced intertrochanteric fracture of left femur, initial encounter for closed fracture: Secondary | ICD-10-CM | POA: Diagnosis not present

## 2021-09-09 DIAGNOSIS — J61 Pneumoconiosis due to asbestos and other mineral fibers: Secondary | ICD-10-CM | POA: Diagnosis not present

## 2021-09-09 DIAGNOSIS — N183 Chronic kidney disease, stage 3 unspecified: Secondary | ICD-10-CM | POA: Diagnosis not present

## 2021-09-09 DIAGNOSIS — I5032 Chronic diastolic (congestive) heart failure: Secondary | ICD-10-CM | POA: Diagnosis not present

## 2021-09-09 DIAGNOSIS — E1122 Type 2 diabetes mellitus with diabetic chronic kidney disease: Secondary | ICD-10-CM | POA: Diagnosis not present

## 2021-09-09 DIAGNOSIS — M6281 Muscle weakness (generalized): Secondary | ICD-10-CM | POA: Diagnosis not present

## 2021-09-09 DIAGNOSIS — R338 Other retention of urine: Secondary | ICD-10-CM | POA: Diagnosis not present

## 2021-09-09 DIAGNOSIS — Z7902 Long term (current) use of antithrombotics/antiplatelets: Secondary | ICD-10-CM | POA: Diagnosis not present

## 2021-09-09 DIAGNOSIS — E875 Hyperkalemia: Secondary | ICD-10-CM | POA: Diagnosis not present

## 2021-09-09 DIAGNOSIS — R52 Pain, unspecified: Secondary | ICD-10-CM | POA: Diagnosis not present

## 2021-09-09 DIAGNOSIS — I509 Heart failure, unspecified: Secondary | ICD-10-CM | POA: Diagnosis not present

## 2021-09-09 DIAGNOSIS — Z7984 Long term (current) use of oral hypoglycemic drugs: Secondary | ICD-10-CM | POA: Diagnosis not present

## 2021-09-09 DIAGNOSIS — Z886 Allergy status to analgesic agent status: Secondary | ICD-10-CM | POA: Diagnosis not present

## 2021-09-09 DIAGNOSIS — I129 Hypertensive chronic kidney disease with stage 1 through stage 4 chronic kidney disease, or unspecified chronic kidney disease: Secondary | ICD-10-CM | POA: Diagnosis not present

## 2021-09-09 DIAGNOSIS — Z7409 Other reduced mobility: Secondary | ICD-10-CM | POA: Diagnosis not present

## 2021-09-09 DIAGNOSIS — R040 Epistaxis: Secondary | ICD-10-CM | POA: Diagnosis not present

## 2021-09-09 DIAGNOSIS — I517 Cardiomegaly: Secondary | ICD-10-CM | POA: Diagnosis not present

## 2021-09-09 DIAGNOSIS — D509 Iron deficiency anemia, unspecified: Secondary | ICD-10-CM | POA: Diagnosis not present

## 2021-09-09 DIAGNOSIS — R58 Hemorrhage, not elsewhere classified: Secondary | ICD-10-CM | POA: Diagnosis not present

## 2021-09-09 DIAGNOSIS — I10 Essential (primary) hypertension: Secondary | ICD-10-CM | POA: Diagnosis not present

## 2021-09-09 DIAGNOSIS — Z7982 Long term (current) use of aspirin: Secondary | ICD-10-CM | POA: Diagnosis not present

## 2021-09-09 DIAGNOSIS — J8410 Pulmonary fibrosis, unspecified: Secondary | ICD-10-CM | POA: Diagnosis not present

## 2021-09-09 DIAGNOSIS — E119 Type 2 diabetes mellitus without complications: Secondary | ICD-10-CM | POA: Diagnosis not present

## 2021-09-09 DIAGNOSIS — Z741 Need for assistance with personal care: Secondary | ICD-10-CM | POA: Diagnosis not present

## 2021-09-09 DIAGNOSIS — Z79891 Long term (current) use of opiate analgesic: Secondary | ICD-10-CM | POA: Diagnosis not present

## 2021-09-09 DIAGNOSIS — N189 Chronic kidney disease, unspecified: Secondary | ICD-10-CM | POA: Diagnosis not present

## 2021-09-09 DIAGNOSIS — R262 Difficulty in walking, not elsewhere classified: Secondary | ICD-10-CM | POA: Diagnosis not present

## 2021-09-09 DIAGNOSIS — M810 Age-related osteoporosis without current pathological fracture: Secondary | ICD-10-CM | POA: Diagnosis not present

## 2021-09-09 DIAGNOSIS — Z87891 Personal history of nicotine dependence: Secondary | ICD-10-CM | POA: Diagnosis not present

## 2021-09-09 DIAGNOSIS — G8918 Other acute postprocedural pain: Secondary | ICD-10-CM | POA: Diagnosis not present

## 2021-09-09 DIAGNOSIS — M1990 Unspecified osteoarthritis, unspecified site: Secondary | ICD-10-CM | POA: Diagnosis not present

## 2021-09-09 DIAGNOSIS — Z794 Long term (current) use of insulin: Secondary | ICD-10-CM | POA: Diagnosis not present

## 2021-09-09 DIAGNOSIS — R2681 Unsteadiness on feet: Secondary | ICD-10-CM | POA: Diagnosis not present

## 2021-09-09 DIAGNOSIS — J929 Pleural plaque without asbestos: Secondary | ICD-10-CM | POA: Diagnosis not present

## 2021-09-09 DIAGNOSIS — N401 Enlarged prostate with lower urinary tract symptoms: Secondary | ICD-10-CM | POA: Diagnosis not present

## 2021-09-09 DIAGNOSIS — J449 Chronic obstructive pulmonary disease, unspecified: Secondary | ICD-10-CM | POA: Diagnosis not present

## 2021-09-09 DIAGNOSIS — E785 Hyperlipidemia, unspecified: Secondary | ICD-10-CM | POA: Diagnosis not present

## 2021-09-12 DIAGNOSIS — G8918 Other acute postprocedural pain: Secondary | ICD-10-CM | POA: Diagnosis not present

## 2021-09-12 DIAGNOSIS — S72142D Displaced intertrochanteric fracture of left femur, subsequent encounter for closed fracture with routine healing: Secondary | ICD-10-CM | POA: Diagnosis not present

## 2021-09-12 DIAGNOSIS — R262 Difficulty in walking, not elsewhere classified: Secondary | ICD-10-CM | POA: Diagnosis not present

## 2021-09-12 DIAGNOSIS — M810 Age-related osteoporosis without current pathological fracture: Secondary | ICD-10-CM | POA: Diagnosis not present

## 2021-09-19 DIAGNOSIS — R338 Other retention of urine: Secondary | ICD-10-CM | POA: Diagnosis not present

## 2021-09-28 DIAGNOSIS — N189 Chronic kidney disease, unspecified: Secondary | ICD-10-CM | POA: Diagnosis not present

## 2021-09-28 DIAGNOSIS — I5032 Chronic diastolic (congestive) heart failure: Secondary | ICD-10-CM | POA: Diagnosis not present

## 2021-09-28 DIAGNOSIS — I130 Hypertensive heart and chronic kidney disease with heart failure and stage 1 through stage 4 chronic kidney disease, or unspecified chronic kidney disease: Secondary | ICD-10-CM | POA: Diagnosis not present

## 2021-09-28 DIAGNOSIS — E7800 Pure hypercholesterolemia, unspecified: Secondary | ICD-10-CM | POA: Diagnosis not present

## 2021-09-28 DIAGNOSIS — J449 Chronic obstructive pulmonary disease, unspecified: Secondary | ICD-10-CM | POA: Diagnosis not present

## 2021-09-28 DIAGNOSIS — M1990 Unspecified osteoarthritis, unspecified site: Secondary | ICD-10-CM | POA: Diagnosis not present

## 2021-09-28 DIAGNOSIS — I509 Heart failure, unspecified: Secondary | ICD-10-CM | POA: Diagnosis not present

## 2021-09-28 DIAGNOSIS — Z794 Long term (current) use of insulin: Secondary | ICD-10-CM | POA: Diagnosis not present

## 2021-09-28 DIAGNOSIS — Z79891 Long term (current) use of opiate analgesic: Secondary | ICD-10-CM | POA: Diagnosis not present

## 2021-09-28 DIAGNOSIS — D509 Iron deficiency anemia, unspecified: Secondary | ICD-10-CM | POA: Diagnosis not present

## 2021-09-28 DIAGNOSIS — E1122 Type 2 diabetes mellitus with diabetic chronic kidney disease: Secondary | ICD-10-CM | POA: Diagnosis not present

## 2021-09-28 DIAGNOSIS — Z87891 Personal history of nicotine dependence: Secondary | ICD-10-CM | POA: Diagnosis not present

## 2021-09-28 DIAGNOSIS — Z888 Allergy status to other drugs, medicaments and biological substances status: Secondary | ICD-10-CM | POA: Diagnosis not present

## 2021-09-28 DIAGNOSIS — J929 Pleural plaque without asbestos: Secondary | ICD-10-CM | POA: Diagnosis not present

## 2021-09-28 DIAGNOSIS — R040 Epistaxis: Secondary | ICD-10-CM | POA: Diagnosis not present

## 2021-09-28 DIAGNOSIS — R58 Hemorrhage, not elsewhere classified: Secondary | ICD-10-CM | POA: Diagnosis not present

## 2021-09-28 DIAGNOSIS — Z7902 Long term (current) use of antithrombotics/antiplatelets: Secondary | ICD-10-CM | POA: Diagnosis not present

## 2021-09-28 DIAGNOSIS — Z79899 Other long term (current) drug therapy: Secondary | ICD-10-CM | POA: Diagnosis not present

## 2021-09-28 DIAGNOSIS — Z7982 Long term (current) use of aspirin: Secondary | ICD-10-CM | POA: Diagnosis not present

## 2021-09-28 DIAGNOSIS — Z7984 Long term (current) use of oral hypoglycemic drugs: Secondary | ICD-10-CM | POA: Diagnosis not present

## 2021-09-28 DIAGNOSIS — I517 Cardiomegaly: Secondary | ICD-10-CM | POA: Diagnosis not present

## 2021-09-28 DIAGNOSIS — Z886 Allergy status to analgesic agent status: Secondary | ICD-10-CM | POA: Diagnosis not present

## 2021-09-29 DIAGNOSIS — I509 Heart failure, unspecified: Secondary | ICD-10-CM

## 2021-10-01 DIAGNOSIS — N183 Chronic kidney disease, stage 3 unspecified: Secondary | ICD-10-CM | POA: Diagnosis not present

## 2021-10-01 DIAGNOSIS — D649 Anemia, unspecified: Secondary | ICD-10-CM | POA: Diagnosis not present

## 2021-10-01 DIAGNOSIS — I5030 Unspecified diastolic (congestive) heart failure: Secondary | ICD-10-CM | POA: Diagnosis not present

## 2021-10-01 DIAGNOSIS — Z6821 Body mass index (BMI) 21.0-21.9, adult: Secondary | ICD-10-CM | POA: Diagnosis not present

## 2021-10-02 DIAGNOSIS — S72002D Fracture of unspecified part of neck of left femur, subsequent encounter for closed fracture with routine healing: Secondary | ICD-10-CM | POA: Diagnosis not present

## 2021-10-02 DIAGNOSIS — Z9181 History of falling: Secondary | ICD-10-CM | POA: Diagnosis not present

## 2021-10-02 DIAGNOSIS — I130 Hypertensive heart and chronic kidney disease with heart failure and stage 1 through stage 4 chronic kidney disease, or unspecified chronic kidney disease: Secondary | ICD-10-CM | POA: Diagnosis not present

## 2021-10-02 DIAGNOSIS — Z7951 Long term (current) use of inhaled steroids: Secondary | ICD-10-CM | POA: Diagnosis not present

## 2021-10-02 DIAGNOSIS — I509 Heart failure, unspecified: Secondary | ICD-10-CM | POA: Diagnosis not present

## 2021-10-02 DIAGNOSIS — J449 Chronic obstructive pulmonary disease, unspecified: Secondary | ICD-10-CM | POA: Diagnosis not present

## 2021-10-02 DIAGNOSIS — N183 Chronic kidney disease, stage 3 unspecified: Secondary | ICD-10-CM | POA: Diagnosis not present

## 2021-10-02 DIAGNOSIS — D508 Other iron deficiency anemias: Secondary | ICD-10-CM | POA: Diagnosis not present

## 2021-10-02 DIAGNOSIS — Z7984 Long term (current) use of oral hypoglycemic drugs: Secondary | ICD-10-CM | POA: Diagnosis not present

## 2021-10-02 DIAGNOSIS — Z7902 Long term (current) use of antithrombotics/antiplatelets: Secondary | ICD-10-CM | POA: Diagnosis not present

## 2021-10-02 DIAGNOSIS — E1122 Type 2 diabetes mellitus with diabetic chronic kidney disease: Secondary | ICD-10-CM | POA: Diagnosis not present

## 2021-10-02 DIAGNOSIS — Z87891 Personal history of nicotine dependence: Secondary | ICD-10-CM | POA: Diagnosis not present

## 2021-10-02 DIAGNOSIS — I6529 Occlusion and stenosis of unspecified carotid artery: Secondary | ICD-10-CM | POA: Diagnosis not present

## 2021-10-02 DIAGNOSIS — M1990 Unspecified osteoarthritis, unspecified site: Secondary | ICD-10-CM | POA: Diagnosis not present

## 2021-10-03 DIAGNOSIS — N183 Chronic kidney disease, stage 3 unspecified: Secondary | ICD-10-CM | POA: Diagnosis not present

## 2021-10-03 DIAGNOSIS — E1122 Type 2 diabetes mellitus with diabetic chronic kidney disease: Secondary | ICD-10-CM | POA: Diagnosis not present

## 2021-10-03 DIAGNOSIS — J449 Chronic obstructive pulmonary disease, unspecified: Secondary | ICD-10-CM | POA: Diagnosis not present

## 2021-10-03 DIAGNOSIS — S72002D Fracture of unspecified part of neck of left femur, subsequent encounter for closed fracture with routine healing: Secondary | ICD-10-CM | POA: Diagnosis not present

## 2021-10-03 DIAGNOSIS — I130 Hypertensive heart and chronic kidney disease with heart failure and stage 1 through stage 4 chronic kidney disease, or unspecified chronic kidney disease: Secondary | ICD-10-CM | POA: Diagnosis not present

## 2021-10-03 DIAGNOSIS — I509 Heart failure, unspecified: Secondary | ICD-10-CM | POA: Diagnosis not present

## 2021-10-06 DIAGNOSIS — N183 Chronic kidney disease, stage 3 unspecified: Secondary | ICD-10-CM | POA: Diagnosis not present

## 2021-10-07 DIAGNOSIS — J449 Chronic obstructive pulmonary disease, unspecified: Secondary | ICD-10-CM | POA: Diagnosis not present

## 2021-10-07 DIAGNOSIS — S72002D Fracture of unspecified part of neck of left femur, subsequent encounter for closed fracture with routine healing: Secondary | ICD-10-CM | POA: Diagnosis not present

## 2021-10-07 DIAGNOSIS — E1122 Type 2 diabetes mellitus with diabetic chronic kidney disease: Secondary | ICD-10-CM | POA: Diagnosis not present

## 2021-10-07 DIAGNOSIS — I509 Heart failure, unspecified: Secondary | ICD-10-CM | POA: Diagnosis not present

## 2021-10-07 DIAGNOSIS — N183 Chronic kidney disease, stage 3 unspecified: Secondary | ICD-10-CM | POA: Diagnosis not present

## 2021-10-07 DIAGNOSIS — I130 Hypertensive heart and chronic kidney disease with heart failure and stage 1 through stage 4 chronic kidney disease, or unspecified chronic kidney disease: Secondary | ICD-10-CM | POA: Diagnosis not present

## 2021-10-08 DIAGNOSIS — I5030 Unspecified diastolic (congestive) heart failure: Secondary | ICD-10-CM | POA: Diagnosis not present

## 2021-10-08 DIAGNOSIS — N183 Chronic kidney disease, stage 3 unspecified: Secondary | ICD-10-CM | POA: Diagnosis not present

## 2021-10-08 DIAGNOSIS — Z6824 Body mass index (BMI) 24.0-24.9, adult: Secondary | ICD-10-CM | POA: Diagnosis not present

## 2021-10-08 DIAGNOSIS — D649 Anemia, unspecified: Secondary | ICD-10-CM | POA: Diagnosis not present

## 2021-10-09 DIAGNOSIS — J449 Chronic obstructive pulmonary disease, unspecified: Secondary | ICD-10-CM | POA: Diagnosis not present

## 2021-10-09 DIAGNOSIS — I130 Hypertensive heart and chronic kidney disease with heart failure and stage 1 through stage 4 chronic kidney disease, or unspecified chronic kidney disease: Secondary | ICD-10-CM | POA: Diagnosis not present

## 2021-10-09 DIAGNOSIS — N183 Chronic kidney disease, stage 3 unspecified: Secondary | ICD-10-CM | POA: Diagnosis not present

## 2021-10-09 DIAGNOSIS — I509 Heart failure, unspecified: Secondary | ICD-10-CM | POA: Diagnosis not present

## 2021-10-09 DIAGNOSIS — E1122 Type 2 diabetes mellitus with diabetic chronic kidney disease: Secondary | ICD-10-CM | POA: Diagnosis not present

## 2021-10-09 DIAGNOSIS — S72002D Fracture of unspecified part of neck of left femur, subsequent encounter for closed fracture with routine healing: Secondary | ICD-10-CM | POA: Diagnosis not present

## 2021-10-13 DIAGNOSIS — N390 Urinary tract infection, site not specified: Secondary | ICD-10-CM | POA: Diagnosis not present

## 2021-10-13 DIAGNOSIS — N401 Enlarged prostate with lower urinary tract symptoms: Secondary | ICD-10-CM | POA: Diagnosis not present

## 2021-10-13 DIAGNOSIS — R351 Nocturia: Secondary | ICD-10-CM | POA: Diagnosis not present

## 2021-10-13 DIAGNOSIS — R972 Elevated prostate specific antigen [PSA]: Secondary | ICD-10-CM | POA: Diagnosis not present

## 2021-10-14 DIAGNOSIS — I509 Heart failure, unspecified: Secondary | ICD-10-CM | POA: Diagnosis not present

## 2021-10-14 DIAGNOSIS — S72002D Fracture of unspecified part of neck of left femur, subsequent encounter for closed fracture with routine healing: Secondary | ICD-10-CM | POA: Diagnosis not present

## 2021-10-14 DIAGNOSIS — N183 Chronic kidney disease, stage 3 unspecified: Secondary | ICD-10-CM | POA: Diagnosis not present

## 2021-10-14 DIAGNOSIS — J449 Chronic obstructive pulmonary disease, unspecified: Secondary | ICD-10-CM | POA: Diagnosis not present

## 2021-10-14 DIAGNOSIS — E1122 Type 2 diabetes mellitus with diabetic chronic kidney disease: Secondary | ICD-10-CM | POA: Diagnosis not present

## 2021-10-14 DIAGNOSIS — I130 Hypertensive heart and chronic kidney disease with heart failure and stage 1 through stage 4 chronic kidney disease, or unspecified chronic kidney disease: Secondary | ICD-10-CM | POA: Diagnosis not present

## 2021-10-15 DIAGNOSIS — N183 Chronic kidney disease, stage 3 unspecified: Secondary | ICD-10-CM | POA: Diagnosis not present

## 2021-10-15 DIAGNOSIS — D631 Anemia in chronic kidney disease: Secondary | ICD-10-CM | POA: Diagnosis not present

## 2021-10-16 DIAGNOSIS — J342 Deviated nasal septum: Secondary | ICD-10-CM | POA: Diagnosis not present

## 2021-10-16 DIAGNOSIS — N183 Chronic kidney disease, stage 3 unspecified: Secondary | ICD-10-CM | POA: Diagnosis not present

## 2021-10-16 DIAGNOSIS — I130 Hypertensive heart and chronic kidney disease with heart failure and stage 1 through stage 4 chronic kidney disease, or unspecified chronic kidney disease: Secondary | ICD-10-CM | POA: Diagnosis not present

## 2021-10-16 DIAGNOSIS — R040 Epistaxis: Secondary | ICD-10-CM | POA: Diagnosis not present

## 2021-10-16 DIAGNOSIS — E1122 Type 2 diabetes mellitus with diabetic chronic kidney disease: Secondary | ICD-10-CM | POA: Diagnosis not present

## 2021-10-16 DIAGNOSIS — I509 Heart failure, unspecified: Secondary | ICD-10-CM | POA: Diagnosis not present

## 2021-10-16 DIAGNOSIS — J449 Chronic obstructive pulmonary disease, unspecified: Secondary | ICD-10-CM | POA: Diagnosis not present

## 2021-10-16 DIAGNOSIS — Z7901 Long term (current) use of anticoagulants: Secondary | ICD-10-CM | POA: Diagnosis not present

## 2021-10-16 DIAGNOSIS — S72002D Fracture of unspecified part of neck of left femur, subsequent encounter for closed fracture with routine healing: Secondary | ICD-10-CM | POA: Diagnosis not present

## 2021-10-17 DIAGNOSIS — N183 Chronic kidney disease, stage 3 unspecified: Secondary | ICD-10-CM | POA: Diagnosis not present

## 2021-10-17 DIAGNOSIS — J449 Chronic obstructive pulmonary disease, unspecified: Secondary | ICD-10-CM | POA: Diagnosis not present

## 2021-10-17 DIAGNOSIS — S72002D Fracture of unspecified part of neck of left femur, subsequent encounter for closed fracture with routine healing: Secondary | ICD-10-CM | POA: Diagnosis not present

## 2021-10-17 DIAGNOSIS — I509 Heart failure, unspecified: Secondary | ICD-10-CM | POA: Diagnosis not present

## 2021-10-17 DIAGNOSIS — E1122 Type 2 diabetes mellitus with diabetic chronic kidney disease: Secondary | ICD-10-CM | POA: Diagnosis not present

## 2021-10-17 DIAGNOSIS — I130 Hypertensive heart and chronic kidney disease with heart failure and stage 1 through stage 4 chronic kidney disease, or unspecified chronic kidney disease: Secondary | ICD-10-CM | POA: Diagnosis not present

## 2021-10-21 DIAGNOSIS — S72002D Fracture of unspecified part of neck of left femur, subsequent encounter for closed fracture with routine healing: Secondary | ICD-10-CM | POA: Diagnosis not present

## 2021-10-21 DIAGNOSIS — I509 Heart failure, unspecified: Secondary | ICD-10-CM | POA: Diagnosis not present

## 2021-10-21 DIAGNOSIS — N183 Chronic kidney disease, stage 3 unspecified: Secondary | ICD-10-CM | POA: Diagnosis not present

## 2021-10-21 DIAGNOSIS — E1122 Type 2 diabetes mellitus with diabetic chronic kidney disease: Secondary | ICD-10-CM | POA: Diagnosis not present

## 2021-10-21 DIAGNOSIS — I130 Hypertensive heart and chronic kidney disease with heart failure and stage 1 through stage 4 chronic kidney disease, or unspecified chronic kidney disease: Secondary | ICD-10-CM | POA: Diagnosis not present

## 2021-10-21 DIAGNOSIS — J449 Chronic obstructive pulmonary disease, unspecified: Secondary | ICD-10-CM | POA: Diagnosis not present

## 2021-10-22 DIAGNOSIS — R040 Epistaxis: Secondary | ICD-10-CM | POA: Diagnosis not present

## 2021-10-22 DIAGNOSIS — Z7902 Long term (current) use of antithrombotics/antiplatelets: Secondary | ICD-10-CM | POA: Diagnosis not present

## 2021-10-22 DIAGNOSIS — Z7982 Long term (current) use of aspirin: Secondary | ICD-10-CM | POA: Diagnosis not present

## 2021-10-22 DIAGNOSIS — J342 Deviated nasal septum: Secondary | ICD-10-CM | POA: Diagnosis not present

## 2021-10-23 DIAGNOSIS — I509 Heart failure, unspecified: Secondary | ICD-10-CM | POA: Diagnosis not present

## 2021-10-23 DIAGNOSIS — N183 Chronic kidney disease, stage 3 unspecified: Secondary | ICD-10-CM | POA: Diagnosis not present

## 2021-10-23 DIAGNOSIS — I130 Hypertensive heart and chronic kidney disease with heart failure and stage 1 through stage 4 chronic kidney disease, or unspecified chronic kidney disease: Secondary | ICD-10-CM | POA: Diagnosis not present

## 2021-10-23 DIAGNOSIS — J449 Chronic obstructive pulmonary disease, unspecified: Secondary | ICD-10-CM | POA: Diagnosis not present

## 2021-10-23 DIAGNOSIS — S72002D Fracture of unspecified part of neck of left femur, subsequent encounter for closed fracture with routine healing: Secondary | ICD-10-CM | POA: Diagnosis not present

## 2021-10-23 DIAGNOSIS — E1122 Type 2 diabetes mellitus with diabetic chronic kidney disease: Secondary | ICD-10-CM | POA: Diagnosis not present

## 2021-10-24 DIAGNOSIS — S72002D Fracture of unspecified part of neck of left femur, subsequent encounter for closed fracture with routine healing: Secondary | ICD-10-CM | POA: Diagnosis not present

## 2021-10-24 DIAGNOSIS — I509 Heart failure, unspecified: Secondary | ICD-10-CM | POA: Diagnosis not present

## 2021-10-24 DIAGNOSIS — E1122 Type 2 diabetes mellitus with diabetic chronic kidney disease: Secondary | ICD-10-CM | POA: Diagnosis not present

## 2021-10-24 DIAGNOSIS — R300 Dysuria: Secondary | ICD-10-CM | POA: Diagnosis not present

## 2021-10-24 DIAGNOSIS — N183 Chronic kidney disease, stage 3 unspecified: Secondary | ICD-10-CM | POA: Diagnosis not present

## 2021-10-24 DIAGNOSIS — J449 Chronic obstructive pulmonary disease, unspecified: Secondary | ICD-10-CM | POA: Diagnosis not present

## 2021-10-24 DIAGNOSIS — I130 Hypertensive heart and chronic kidney disease with heart failure and stage 1 through stage 4 chronic kidney disease, or unspecified chronic kidney disease: Secondary | ICD-10-CM | POA: Diagnosis not present

## 2021-10-28 DIAGNOSIS — S72002D Fracture of unspecified part of neck of left femur, subsequent encounter for closed fracture with routine healing: Secondary | ICD-10-CM | POA: Diagnosis not present

## 2021-10-28 DIAGNOSIS — J449 Chronic obstructive pulmonary disease, unspecified: Secondary | ICD-10-CM | POA: Diagnosis not present

## 2021-10-28 DIAGNOSIS — I509 Heart failure, unspecified: Secondary | ICD-10-CM | POA: Diagnosis not present

## 2021-10-28 DIAGNOSIS — I130 Hypertensive heart and chronic kidney disease with heart failure and stage 1 through stage 4 chronic kidney disease, or unspecified chronic kidney disease: Secondary | ICD-10-CM | POA: Diagnosis not present

## 2021-10-28 DIAGNOSIS — E1122 Type 2 diabetes mellitus with diabetic chronic kidney disease: Secondary | ICD-10-CM | POA: Diagnosis not present

## 2021-10-28 DIAGNOSIS — N183 Chronic kidney disease, stage 3 unspecified: Secondary | ICD-10-CM | POA: Diagnosis not present

## 2021-10-29 DIAGNOSIS — D631 Anemia in chronic kidney disease: Secondary | ICD-10-CM | POA: Diagnosis not present

## 2021-10-29 DIAGNOSIS — N183 Chronic kidney disease, stage 3 unspecified: Secondary | ICD-10-CM | POA: Diagnosis not present

## 2021-10-30 DIAGNOSIS — I509 Heart failure, unspecified: Secondary | ICD-10-CM | POA: Diagnosis not present

## 2021-10-30 DIAGNOSIS — I130 Hypertensive heart and chronic kidney disease with heart failure and stage 1 through stage 4 chronic kidney disease, or unspecified chronic kidney disease: Secondary | ICD-10-CM | POA: Diagnosis not present

## 2021-10-30 DIAGNOSIS — S72002D Fracture of unspecified part of neck of left femur, subsequent encounter for closed fracture with routine healing: Secondary | ICD-10-CM | POA: Diagnosis not present

## 2021-10-30 DIAGNOSIS — E1122 Type 2 diabetes mellitus with diabetic chronic kidney disease: Secondary | ICD-10-CM | POA: Diagnosis not present

## 2021-10-30 DIAGNOSIS — J449 Chronic obstructive pulmonary disease, unspecified: Secondary | ICD-10-CM | POA: Diagnosis not present

## 2021-10-30 DIAGNOSIS — N183 Chronic kidney disease, stage 3 unspecified: Secondary | ICD-10-CM | POA: Diagnosis not present

## 2021-11-01 DIAGNOSIS — D508 Other iron deficiency anemias: Secondary | ICD-10-CM | POA: Diagnosis not present

## 2021-11-01 DIAGNOSIS — I6529 Occlusion and stenosis of unspecified carotid artery: Secondary | ICD-10-CM | POA: Diagnosis not present

## 2021-11-01 DIAGNOSIS — Z7984 Long term (current) use of oral hypoglycemic drugs: Secondary | ICD-10-CM | POA: Diagnosis not present

## 2021-11-01 DIAGNOSIS — N183 Chronic kidney disease, stage 3 unspecified: Secondary | ICD-10-CM | POA: Diagnosis not present

## 2021-11-01 DIAGNOSIS — E1122 Type 2 diabetes mellitus with diabetic chronic kidney disease: Secondary | ICD-10-CM | POA: Diagnosis not present

## 2021-11-01 DIAGNOSIS — Z7951 Long term (current) use of inhaled steroids: Secondary | ICD-10-CM | POA: Diagnosis not present

## 2021-11-01 DIAGNOSIS — Z9181 History of falling: Secondary | ICD-10-CM | POA: Diagnosis not present

## 2021-11-01 DIAGNOSIS — S72002D Fracture of unspecified part of neck of left femur, subsequent encounter for closed fracture with routine healing: Secondary | ICD-10-CM | POA: Diagnosis not present

## 2021-11-01 DIAGNOSIS — I130 Hypertensive heart and chronic kidney disease with heart failure and stage 1 through stage 4 chronic kidney disease, or unspecified chronic kidney disease: Secondary | ICD-10-CM | POA: Diagnosis not present

## 2021-11-01 DIAGNOSIS — Z87891 Personal history of nicotine dependence: Secondary | ICD-10-CM | POA: Diagnosis not present

## 2021-11-01 DIAGNOSIS — I509 Heart failure, unspecified: Secondary | ICD-10-CM | POA: Diagnosis not present

## 2021-11-01 DIAGNOSIS — M1990 Unspecified osteoarthritis, unspecified site: Secondary | ICD-10-CM | POA: Diagnosis not present

## 2021-11-01 DIAGNOSIS — Z7902 Long term (current) use of antithrombotics/antiplatelets: Secondary | ICD-10-CM | POA: Diagnosis not present

## 2021-11-01 DIAGNOSIS — J449 Chronic obstructive pulmonary disease, unspecified: Secondary | ICD-10-CM | POA: Diagnosis not present

## 2021-11-03 DIAGNOSIS — S72002D Fracture of unspecified part of neck of left femur, subsequent encounter for closed fracture with routine healing: Secondary | ICD-10-CM | POA: Diagnosis not present

## 2021-11-03 DIAGNOSIS — J449 Chronic obstructive pulmonary disease, unspecified: Secondary | ICD-10-CM | POA: Diagnosis not present

## 2021-11-03 DIAGNOSIS — I509 Heart failure, unspecified: Secondary | ICD-10-CM | POA: Diagnosis not present

## 2021-11-03 DIAGNOSIS — N183 Chronic kidney disease, stage 3 unspecified: Secondary | ICD-10-CM | POA: Diagnosis not present

## 2021-11-03 DIAGNOSIS — E1122 Type 2 diabetes mellitus with diabetic chronic kidney disease: Secondary | ICD-10-CM | POA: Diagnosis not present

## 2021-11-03 DIAGNOSIS — N390 Urinary tract infection, site not specified: Secondary | ICD-10-CM | POA: Diagnosis not present

## 2021-11-03 DIAGNOSIS — I130 Hypertensive heart and chronic kidney disease with heart failure and stage 1 through stage 4 chronic kidney disease, or unspecified chronic kidney disease: Secondary | ICD-10-CM | POA: Diagnosis not present

## 2021-11-04 DIAGNOSIS — S72142A Displaced intertrochanteric fracture of left femur, initial encounter for closed fracture: Secondary | ICD-10-CM | POA: Diagnosis not present

## 2021-11-05 DIAGNOSIS — Z23 Encounter for immunization: Secondary | ICD-10-CM | POA: Diagnosis not present

## 2021-11-06 DIAGNOSIS — J449 Chronic obstructive pulmonary disease, unspecified: Secondary | ICD-10-CM | POA: Diagnosis not present

## 2021-11-06 DIAGNOSIS — S72002D Fracture of unspecified part of neck of left femur, subsequent encounter for closed fracture with routine healing: Secondary | ICD-10-CM | POA: Diagnosis not present

## 2021-11-06 DIAGNOSIS — N183 Chronic kidney disease, stage 3 unspecified: Secondary | ICD-10-CM | POA: Diagnosis not present

## 2021-11-06 DIAGNOSIS — I130 Hypertensive heart and chronic kidney disease with heart failure and stage 1 through stage 4 chronic kidney disease, or unspecified chronic kidney disease: Secondary | ICD-10-CM | POA: Diagnosis not present

## 2021-11-06 DIAGNOSIS — E1122 Type 2 diabetes mellitus with diabetic chronic kidney disease: Secondary | ICD-10-CM | POA: Diagnosis not present

## 2021-11-06 DIAGNOSIS — I509 Heart failure, unspecified: Secondary | ICD-10-CM | POA: Diagnosis not present

## 2021-11-12 DIAGNOSIS — D631 Anemia in chronic kidney disease: Secondary | ICD-10-CM | POA: Diagnosis not present

## 2021-11-12 DIAGNOSIS — N183 Chronic kidney disease, stage 3 unspecified: Secondary | ICD-10-CM | POA: Diagnosis not present

## 2021-11-17 DIAGNOSIS — J61 Pneumoconiosis due to asbestos and other mineral fibers: Secondary | ICD-10-CM | POA: Diagnosis not present

## 2021-11-17 DIAGNOSIS — J301 Allergic rhinitis due to pollen: Secondary | ICD-10-CM | POA: Diagnosis not present

## 2021-11-17 DIAGNOSIS — J449 Chronic obstructive pulmonary disease, unspecified: Secondary | ICD-10-CM | POA: Diagnosis not present

## 2021-11-18 DIAGNOSIS — S72002D Fracture of unspecified part of neck of left femur, subsequent encounter for closed fracture with routine healing: Secondary | ICD-10-CM | POA: Diagnosis not present

## 2021-11-18 DIAGNOSIS — R399 Unspecified symptoms and signs involving the genitourinary system: Secondary | ICD-10-CM | POA: Diagnosis not present

## 2021-11-18 DIAGNOSIS — J449 Chronic obstructive pulmonary disease, unspecified: Secondary | ICD-10-CM | POA: Diagnosis not present

## 2021-11-18 DIAGNOSIS — E1122 Type 2 diabetes mellitus with diabetic chronic kidney disease: Secondary | ICD-10-CM | POA: Diagnosis not present

## 2021-11-18 DIAGNOSIS — N183 Chronic kidney disease, stage 3 unspecified: Secondary | ICD-10-CM | POA: Diagnosis not present

## 2021-11-18 DIAGNOSIS — I130 Hypertensive heart and chronic kidney disease with heart failure and stage 1 through stage 4 chronic kidney disease, or unspecified chronic kidney disease: Secondary | ICD-10-CM | POA: Diagnosis not present

## 2021-11-18 DIAGNOSIS — I509 Heart failure, unspecified: Secondary | ICD-10-CM | POA: Diagnosis not present

## 2021-11-25 DIAGNOSIS — E1169 Type 2 diabetes mellitus with other specified complication: Secondary | ICD-10-CM | POA: Diagnosis not present

## 2021-11-25 DIAGNOSIS — E1159 Type 2 diabetes mellitus with other circulatory complications: Secondary | ICD-10-CM | POA: Diagnosis not present

## 2021-11-25 DIAGNOSIS — E1129 Type 2 diabetes mellitus with other diabetic kidney complication: Secondary | ICD-10-CM | POA: Diagnosis not present

## 2021-11-26 DIAGNOSIS — D631 Anemia in chronic kidney disease: Secondary | ICD-10-CM | POA: Diagnosis not present

## 2021-11-26 DIAGNOSIS — N183 Chronic kidney disease, stage 3 unspecified: Secondary | ICD-10-CM | POA: Diagnosis not present

## 2021-12-01 DIAGNOSIS — Z6823 Body mass index (BMI) 23.0-23.9, adult: Secondary | ICD-10-CM | POA: Diagnosis not present

## 2021-12-01 DIAGNOSIS — E1159 Type 2 diabetes mellitus with other circulatory complications: Secondary | ICD-10-CM | POA: Diagnosis not present

## 2021-12-01 DIAGNOSIS — E1169 Type 2 diabetes mellitus with other specified complication: Secondary | ICD-10-CM | POA: Diagnosis not present

## 2021-12-01 DIAGNOSIS — E785 Hyperlipidemia, unspecified: Secondary | ICD-10-CM | POA: Diagnosis not present

## 2021-12-01 DIAGNOSIS — I152 Hypertension secondary to endocrine disorders: Secondary | ICD-10-CM | POA: Diagnosis not present

## 2021-12-01 DIAGNOSIS — E875 Hyperkalemia: Secondary | ICD-10-CM | POA: Diagnosis not present

## 2021-12-02 DIAGNOSIS — J61 Pneumoconiosis due to asbestos and other mineral fibers: Secondary | ICD-10-CM | POA: Diagnosis not present

## 2021-12-02 DIAGNOSIS — J449 Chronic obstructive pulmonary disease, unspecified: Secondary | ICD-10-CM | POA: Diagnosis not present

## 2021-12-02 DIAGNOSIS — J301 Allergic rhinitis due to pollen: Secondary | ICD-10-CM | POA: Diagnosis not present

## 2021-12-03 DIAGNOSIS — N401 Enlarged prostate with lower urinary tract symptoms: Secondary | ICD-10-CM | POA: Diagnosis not present

## 2021-12-03 DIAGNOSIS — N481 Balanitis: Secondary | ICD-10-CM | POA: Diagnosis not present

## 2021-12-03 DIAGNOSIS — N390 Urinary tract infection, site not specified: Secondary | ICD-10-CM | POA: Diagnosis not present

## 2021-12-03 DIAGNOSIS — R351 Nocturia: Secondary | ICD-10-CM | POA: Diagnosis not present

## 2021-12-08 DIAGNOSIS — N1832 Chronic kidney disease, stage 3b: Secondary | ICD-10-CM | POA: Diagnosis not present

## 2021-12-08 DIAGNOSIS — Z6823 Body mass index (BMI) 23.0-23.9, adult: Secondary | ICD-10-CM | POA: Diagnosis not present

## 2021-12-10 DIAGNOSIS — N183 Chronic kidney disease, stage 3 unspecified: Secondary | ICD-10-CM | POA: Diagnosis not present

## 2021-12-10 DIAGNOSIS — D631 Anemia in chronic kidney disease: Secondary | ICD-10-CM | POA: Diagnosis not present

## 2021-12-17 DIAGNOSIS — S72142A Displaced intertrochanteric fracture of left femur, initial encounter for closed fracture: Secondary | ICD-10-CM | POA: Diagnosis not present

## 2021-12-25 DIAGNOSIS — N183 Chronic kidney disease, stage 3 unspecified: Secondary | ICD-10-CM | POA: Diagnosis not present

## 2021-12-25 DIAGNOSIS — D631 Anemia in chronic kidney disease: Secondary | ICD-10-CM | POA: Diagnosis not present

## 2022-01-02 DIAGNOSIS — N390 Urinary tract infection, site not specified: Secondary | ICD-10-CM | POA: Diagnosis not present

## 2022-01-02 DIAGNOSIS — Z6823 Body mass index (BMI) 23.0-23.9, adult: Secondary | ICD-10-CM | POA: Diagnosis not present

## 2022-01-02 DIAGNOSIS — R300 Dysuria: Secondary | ICD-10-CM | POA: Diagnosis not present

## 2022-01-08 DIAGNOSIS — D631 Anemia in chronic kidney disease: Secondary | ICD-10-CM | POA: Diagnosis not present

## 2022-01-08 DIAGNOSIS — N183 Chronic kidney disease, stage 3 unspecified: Secondary | ICD-10-CM | POA: Diagnosis not present

## 2022-01-13 DIAGNOSIS — S72142A Displaced intertrochanteric fracture of left femur, initial encounter for closed fracture: Secondary | ICD-10-CM | POA: Diagnosis not present

## 2022-01-22 DIAGNOSIS — N183 Chronic kidney disease, stage 3 unspecified: Secondary | ICD-10-CM | POA: Diagnosis not present

## 2022-01-22 DIAGNOSIS — D631 Anemia in chronic kidney disease: Secondary | ICD-10-CM | POA: Diagnosis not present

## 2022-01-26 DIAGNOSIS — E1122 Type 2 diabetes mellitus with diabetic chronic kidney disease: Secondary | ICD-10-CM | POA: Diagnosis not present

## 2022-01-26 DIAGNOSIS — D631 Anemia in chronic kidney disease: Secondary | ICD-10-CM | POA: Diagnosis not present

## 2022-01-26 DIAGNOSIS — I129 Hypertensive chronic kidney disease with stage 1 through stage 4 chronic kidney disease, or unspecified chronic kidney disease: Secondary | ICD-10-CM | POA: Diagnosis not present

## 2022-01-26 DIAGNOSIS — N390 Urinary tract infection, site not specified: Secondary | ICD-10-CM | POA: Diagnosis not present

## 2022-01-26 DIAGNOSIS — N183 Chronic kidney disease, stage 3 unspecified: Secondary | ICD-10-CM | POA: Diagnosis not present

## 2022-01-26 DIAGNOSIS — N189 Chronic kidney disease, unspecified: Secondary | ICD-10-CM | POA: Diagnosis not present

## 2022-02-05 DIAGNOSIS — N183 Chronic kidney disease, stage 3 unspecified: Secondary | ICD-10-CM | POA: Diagnosis not present

## 2022-02-05 DIAGNOSIS — D631 Anemia in chronic kidney disease: Secondary | ICD-10-CM | POA: Diagnosis not present

## 2022-02-19 DIAGNOSIS — D631 Anemia in chronic kidney disease: Secondary | ICD-10-CM | POA: Diagnosis not present

## 2022-02-19 DIAGNOSIS — N183 Chronic kidney disease, stage 3 unspecified: Secondary | ICD-10-CM | POA: Diagnosis not present

## 2022-02-23 DIAGNOSIS — I152 Hypertension secondary to endocrine disorders: Secondary | ICD-10-CM | POA: Diagnosis not present

## 2022-02-23 DIAGNOSIS — E1159 Type 2 diabetes mellitus with other circulatory complications: Secondary | ICD-10-CM | POA: Diagnosis not present

## 2022-02-25 DIAGNOSIS — Z6823 Body mass index (BMI) 23.0-23.9, adult: Secondary | ICD-10-CM | POA: Diagnosis not present

## 2022-02-25 DIAGNOSIS — I10 Essential (primary) hypertension: Secondary | ICD-10-CM | POA: Diagnosis not present

## 2022-02-25 DIAGNOSIS — Z20822 Contact with and (suspected) exposure to covid-19: Secondary | ICD-10-CM | POA: Diagnosis not present

## 2022-02-25 DIAGNOSIS — J029 Acute pharyngitis, unspecified: Secondary | ICD-10-CM | POA: Diagnosis not present

## 2022-03-04 DIAGNOSIS — J301 Allergic rhinitis due to pollen: Secondary | ICD-10-CM | POA: Diagnosis not present

## 2022-03-04 DIAGNOSIS — J61 Pneumoconiosis due to asbestos and other mineral fibers: Secondary | ICD-10-CM | POA: Diagnosis not present

## 2022-03-04 DIAGNOSIS — J449 Chronic obstructive pulmonary disease, unspecified: Secondary | ICD-10-CM | POA: Diagnosis not present

## 2022-03-05 DIAGNOSIS — D631 Anemia in chronic kidney disease: Secondary | ICD-10-CM | POA: Diagnosis not present

## 2022-03-05 DIAGNOSIS — N183 Chronic kidney disease, stage 3 unspecified: Secondary | ICD-10-CM | POA: Diagnosis not present

## 2022-03-12 DIAGNOSIS — E1159 Type 2 diabetes mellitus with other circulatory complications: Secondary | ICD-10-CM | POA: Diagnosis not present

## 2022-03-12 DIAGNOSIS — I10 Essential (primary) hypertension: Secondary | ICD-10-CM | POA: Diagnosis not present

## 2022-03-19 DIAGNOSIS — N183 Chronic kidney disease, stage 3 unspecified: Secondary | ICD-10-CM | POA: Diagnosis not present

## 2022-03-19 DIAGNOSIS — D631 Anemia in chronic kidney disease: Secondary | ICD-10-CM | POA: Diagnosis not present

## 2022-03-27 DIAGNOSIS — Z0000 Encounter for general adult medical examination without abnormal findings: Secondary | ICD-10-CM | POA: Diagnosis not present

## 2022-03-27 DIAGNOSIS — I10 Essential (primary) hypertension: Secondary | ICD-10-CM | POA: Diagnosis not present

## 2022-03-27 DIAGNOSIS — Z139 Encounter for screening, unspecified: Secondary | ICD-10-CM | POA: Diagnosis not present

## 2022-03-27 DIAGNOSIS — Z1331 Encounter for screening for depression: Secondary | ICD-10-CM | POA: Diagnosis not present

## 2022-03-27 DIAGNOSIS — Z136 Encounter for screening for cardiovascular disorders: Secondary | ICD-10-CM | POA: Diagnosis not present

## 2022-03-27 DIAGNOSIS — Z789 Other specified health status: Secondary | ICD-10-CM | POA: Diagnosis not present

## 2022-03-27 DIAGNOSIS — Z6824 Body mass index (BMI) 24.0-24.9, adult: Secondary | ICD-10-CM | POA: Diagnosis not present

## 2022-03-27 DIAGNOSIS — Z1339 Encounter for screening examination for other mental health and behavioral disorders: Secondary | ICD-10-CM | POA: Diagnosis not present

## 2022-03-31 DIAGNOSIS — E1159 Type 2 diabetes mellitus with other circulatory complications: Secondary | ICD-10-CM | POA: Diagnosis not present

## 2022-03-31 DIAGNOSIS — E1169 Type 2 diabetes mellitus with other specified complication: Secondary | ICD-10-CM | POA: Diagnosis not present

## 2022-03-31 DIAGNOSIS — E1129 Type 2 diabetes mellitus with other diabetic kidney complication: Secondary | ICD-10-CM | POA: Diagnosis not present

## 2022-04-02 DIAGNOSIS — D631 Anemia in chronic kidney disease: Secondary | ICD-10-CM | POA: Diagnosis not present

## 2022-04-02 DIAGNOSIS — N183 Chronic kidney disease, stage 3 unspecified: Secondary | ICD-10-CM | POA: Diagnosis not present

## 2022-04-06 DIAGNOSIS — E785 Hyperlipidemia, unspecified: Secondary | ICD-10-CM | POA: Diagnosis not present

## 2022-04-06 DIAGNOSIS — E1169 Type 2 diabetes mellitus with other specified complication: Secondary | ICD-10-CM | POA: Diagnosis not present

## 2022-04-06 DIAGNOSIS — I152 Hypertension secondary to endocrine disorders: Secondary | ICD-10-CM | POA: Diagnosis not present

## 2022-04-06 DIAGNOSIS — E1159 Type 2 diabetes mellitus with other circulatory complications: Secondary | ICD-10-CM | POA: Diagnosis not present

## 2022-04-06 DIAGNOSIS — Z6823 Body mass index (BMI) 23.0-23.9, adult: Secondary | ICD-10-CM | POA: Diagnosis not present

## 2022-04-12 DIAGNOSIS — I10 Essential (primary) hypertension: Secondary | ICD-10-CM | POA: Diagnosis not present

## 2022-04-12 DIAGNOSIS — E1159 Type 2 diabetes mellitus with other circulatory complications: Secondary | ICD-10-CM | POA: Diagnosis not present

## 2022-04-13 DIAGNOSIS — E1159 Type 2 diabetes mellitus with other circulatory complications: Secondary | ICD-10-CM | POA: Diagnosis not present

## 2022-04-13 DIAGNOSIS — I10 Essential (primary) hypertension: Secondary | ICD-10-CM | POA: Diagnosis not present

## 2022-04-16 DIAGNOSIS — N183 Chronic kidney disease, stage 3 unspecified: Secondary | ICD-10-CM | POA: Diagnosis not present

## 2022-04-16 DIAGNOSIS — D631 Anemia in chronic kidney disease: Secondary | ICD-10-CM | POA: Diagnosis not present

## 2022-04-17 DIAGNOSIS — I152 Hypertension secondary to endocrine disorders: Secondary | ICD-10-CM | POA: Diagnosis not present

## 2022-04-17 DIAGNOSIS — E1159 Type 2 diabetes mellitus with other circulatory complications: Secondary | ICD-10-CM | POA: Diagnosis not present

## 2022-04-30 DIAGNOSIS — N183 Chronic kidney disease, stage 3 unspecified: Secondary | ICD-10-CM | POA: Diagnosis not present

## 2022-04-30 DIAGNOSIS — D631 Anemia in chronic kidney disease: Secondary | ICD-10-CM | POA: Diagnosis not present

## 2022-05-06 DIAGNOSIS — R972 Elevated prostate specific antigen [PSA]: Secondary | ICD-10-CM | POA: Diagnosis not present

## 2022-05-06 DIAGNOSIS — R351 Nocturia: Secondary | ICD-10-CM | POA: Diagnosis not present

## 2022-05-06 DIAGNOSIS — N390 Urinary tract infection, site not specified: Secondary | ICD-10-CM | POA: Diagnosis not present

## 2022-05-06 DIAGNOSIS — N401 Enlarged prostate with lower urinary tract symptoms: Secondary | ICD-10-CM | POA: Diagnosis not present

## 2022-05-12 DIAGNOSIS — I10 Essential (primary) hypertension: Secondary | ICD-10-CM | POA: Diagnosis not present

## 2022-05-12 DIAGNOSIS — E1159 Type 2 diabetes mellitus with other circulatory complications: Secondary | ICD-10-CM | POA: Diagnosis not present

## 2022-05-13 DIAGNOSIS — I10 Essential (primary) hypertension: Secondary | ICD-10-CM | POA: Diagnosis not present

## 2022-05-13 DIAGNOSIS — E1159 Type 2 diabetes mellitus with other circulatory complications: Secondary | ICD-10-CM | POA: Diagnosis not present

## 2022-05-14 DIAGNOSIS — D631 Anemia in chronic kidney disease: Secondary | ICD-10-CM | POA: Diagnosis not present

## 2022-05-14 DIAGNOSIS — N183 Chronic kidney disease, stage 3 unspecified: Secondary | ICD-10-CM | POA: Diagnosis not present

## 2022-05-15 DIAGNOSIS — I129 Hypertensive chronic kidney disease with stage 1 through stage 4 chronic kidney disease, or unspecified chronic kidney disease: Secondary | ICD-10-CM | POA: Diagnosis not present

## 2022-05-15 DIAGNOSIS — E1122 Type 2 diabetes mellitus with diabetic chronic kidney disease: Secondary | ICD-10-CM | POA: Diagnosis not present

## 2022-05-15 DIAGNOSIS — N183 Chronic kidney disease, stage 3 unspecified: Secondary | ICD-10-CM | POA: Diagnosis not present

## 2022-05-15 DIAGNOSIS — E559 Vitamin D deficiency, unspecified: Secondary | ICD-10-CM | POA: Diagnosis not present

## 2022-05-15 DIAGNOSIS — D631 Anemia in chronic kidney disease: Secondary | ICD-10-CM | POA: Diagnosis not present

## 2022-05-28 DIAGNOSIS — N183 Chronic kidney disease, stage 3 unspecified: Secondary | ICD-10-CM | POA: Diagnosis not present

## 2022-05-28 DIAGNOSIS — D631 Anemia in chronic kidney disease: Secondary | ICD-10-CM | POA: Diagnosis not present

## 2022-05-29 DIAGNOSIS — N390 Urinary tract infection, site not specified: Secondary | ICD-10-CM | POA: Diagnosis not present

## 2022-05-29 DIAGNOSIS — R197 Diarrhea, unspecified: Secondary | ICD-10-CM | POA: Diagnosis not present

## 2022-05-29 DIAGNOSIS — R109 Unspecified abdominal pain: Secondary | ICD-10-CM | POA: Diagnosis not present

## 2022-06-01 DIAGNOSIS — Z7689 Persons encountering health services in other specified circumstances: Secondary | ICD-10-CM | POA: Diagnosis not present

## 2022-06-02 ENCOUNTER — Telehealth: Payer: Self-pay

## 2022-06-02 NOTE — Telephone Encounter (Signed)
Transition Care Management Unsuccessful Follow-up Telephone Call  Date of discharge and from where:  05/29/2022 [facility]  Attempts:  1st Attempt  Reason for unsuccessful TCM follow-up call:  Left voice message  [place]  [facility] Population Health Community Resource Care Guide   [name]  ?? [phone]   Website: [email or web address]  [name]      

## 2022-06-03 ENCOUNTER — Telehealth: Payer: Self-pay

## 2022-06-03 DIAGNOSIS — R109 Unspecified abdominal pain: Secondary | ICD-10-CM | POA: Diagnosis not present

## 2022-06-03 DIAGNOSIS — I129 Hypertensive chronic kidney disease with stage 1 through stage 4 chronic kidney disease, or unspecified chronic kidney disease: Secondary | ICD-10-CM | POA: Diagnosis not present

## 2022-06-03 DIAGNOSIS — N289 Disorder of kidney and ureter, unspecified: Secondary | ICD-10-CM | POA: Diagnosis not present

## 2022-06-03 DIAGNOSIS — R197 Diarrhea, unspecified: Secondary | ICD-10-CM | POA: Diagnosis not present

## 2022-06-03 DIAGNOSIS — J449 Chronic obstructive pulmonary disease, unspecified: Secondary | ICD-10-CM | POA: Diagnosis not present

## 2022-06-03 DIAGNOSIS — N189 Chronic kidney disease, unspecified: Secondary | ICD-10-CM | POA: Diagnosis not present

## 2022-06-03 DIAGNOSIS — I10 Essential (primary) hypertension: Secondary | ICD-10-CM | POA: Diagnosis not present

## 2022-06-03 DIAGNOSIS — R531 Weakness: Secondary | ICD-10-CM | POA: Diagnosis not present

## 2022-06-03 DIAGNOSIS — I700 Atherosclerosis of aorta: Secondary | ICD-10-CM | POA: Diagnosis not present

## 2022-06-03 NOTE — Telephone Encounter (Signed)
Transition Care Management Follow-up Telephone Call Date of discharge and from where: 05/29/2022 [facility] How have you been since you were released from the hospital? Patient stated he is not feeling much better Any questions or concerns? No  Items Reviewed: Did the pt receive and understand the discharge instructions provided? Yes  Medications obtained and verified? Yes  Other? No  Any new allergies since your discharge?  Lantus Dietary orders reviewed? Yes Do you have support at home? Yes   Follow up appointments reviewed:  PCP Hospital f/u appt confirmed? Yes  Scheduled to see Charlott Rakes MD on 06/05/2022 @ . Specialist Hospital f/u appt confirmed? No  Scheduled to see  on  @ . Are transportation arrangements needed? No  If their condition worsens, is the pt aware to call PCP or go to the Emergency Dept.? Yes Was the patient provided with contact information for the PCP's office or ED? Yes Was to pt encouraged to call back with questions or concerns? Yes  Shelia Magallon [facility]  [facility] Population Health Community Resource Care Guide   [email or web address]  ?? [phone]   Website: [email or web address]  [email or web address]

## 2022-06-05 DIAGNOSIS — E785 Hyperlipidemia, unspecified: Secondary | ICD-10-CM | POA: Diagnosis not present

## 2022-06-05 DIAGNOSIS — E860 Dehydration: Secondary | ICD-10-CM | POA: Diagnosis not present

## 2022-06-05 DIAGNOSIS — E1169 Type 2 diabetes mellitus with other specified complication: Secondary | ICD-10-CM | POA: Diagnosis not present

## 2022-06-05 DIAGNOSIS — I959 Hypotension, unspecified: Secondary | ICD-10-CM | POA: Diagnosis not present

## 2022-06-11 DIAGNOSIS — N183 Chronic kidney disease, stage 3 unspecified: Secondary | ICD-10-CM | POA: Diagnosis not present

## 2022-06-11 DIAGNOSIS — D631 Anemia in chronic kidney disease: Secondary | ICD-10-CM | POA: Diagnosis not present

## 2022-06-12 DIAGNOSIS — E1159 Type 2 diabetes mellitus with other circulatory complications: Secondary | ICD-10-CM | POA: Diagnosis not present

## 2022-06-12 DIAGNOSIS — I10 Essential (primary) hypertension: Secondary | ICD-10-CM | POA: Diagnosis not present

## 2022-06-13 DIAGNOSIS — I10 Essential (primary) hypertension: Secondary | ICD-10-CM | POA: Diagnosis not present

## 2022-06-13 DIAGNOSIS — E1159 Type 2 diabetes mellitus with other circulatory complications: Secondary | ICD-10-CM | POA: Diagnosis not present

## 2022-06-23 DIAGNOSIS — J61 Pneumoconiosis due to asbestos and other mineral fibers: Secondary | ICD-10-CM | POA: Diagnosis not present

## 2022-06-23 DIAGNOSIS — J301 Allergic rhinitis due to pollen: Secondary | ICD-10-CM | POA: Diagnosis not present

## 2022-06-23 DIAGNOSIS — J449 Chronic obstructive pulmonary disease, unspecified: Secondary | ICD-10-CM | POA: Diagnosis not present

## 2022-06-25 DIAGNOSIS — N183 Chronic kidney disease, stage 3 unspecified: Secondary | ICD-10-CM | POA: Diagnosis not present

## 2022-06-25 DIAGNOSIS — D631 Anemia in chronic kidney disease: Secondary | ICD-10-CM | POA: Diagnosis not present

## 2022-07-09 DIAGNOSIS — N183 Chronic kidney disease, stage 3 unspecified: Secondary | ICD-10-CM | POA: Diagnosis not present

## 2022-07-09 DIAGNOSIS — D631 Anemia in chronic kidney disease: Secondary | ICD-10-CM | POA: Diagnosis not present

## 2022-07-09 DIAGNOSIS — E875 Hyperkalemia: Secondary | ICD-10-CM | POA: Diagnosis not present

## 2022-07-12 DIAGNOSIS — E1159 Type 2 diabetes mellitus with other circulatory complications: Secondary | ICD-10-CM | POA: Diagnosis not present

## 2022-07-12 DIAGNOSIS — I10 Essential (primary) hypertension: Secondary | ICD-10-CM | POA: Diagnosis not present

## 2022-07-13 DIAGNOSIS — I10 Essential (primary) hypertension: Secondary | ICD-10-CM | POA: Diagnosis not present

## 2022-07-13 DIAGNOSIS — E1159 Type 2 diabetes mellitus with other circulatory complications: Secondary | ICD-10-CM | POA: Diagnosis not present

## 2022-07-23 DIAGNOSIS — N183 Chronic kidney disease, stage 3 unspecified: Secondary | ICD-10-CM | POA: Diagnosis not present

## 2022-07-23 DIAGNOSIS — D631 Anemia in chronic kidney disease: Secondary | ICD-10-CM | POA: Diagnosis not present

## 2022-07-31 DIAGNOSIS — E1159 Type 2 diabetes mellitus with other circulatory complications: Secondary | ICD-10-CM | POA: Diagnosis not present

## 2022-07-31 DIAGNOSIS — E1129 Type 2 diabetes mellitus with other diabetic kidney complication: Secondary | ICD-10-CM | POA: Diagnosis not present

## 2022-07-31 DIAGNOSIS — E1169 Type 2 diabetes mellitus with other specified complication: Secondary | ICD-10-CM | POA: Diagnosis not present

## 2022-08-06 DIAGNOSIS — E875 Hyperkalemia: Secondary | ICD-10-CM | POA: Diagnosis not present

## 2022-08-06 DIAGNOSIS — D631 Anemia in chronic kidney disease: Secondary | ICD-10-CM | POA: Diagnosis not present

## 2022-08-06 DIAGNOSIS — N183 Chronic kidney disease, stage 3 unspecified: Secondary | ICD-10-CM | POA: Diagnosis not present

## 2022-08-10 DIAGNOSIS — I152 Hypertension secondary to endocrine disorders: Secondary | ICD-10-CM | POA: Diagnosis not present

## 2022-08-10 DIAGNOSIS — E785 Hyperlipidemia, unspecified: Secondary | ICD-10-CM | POA: Diagnosis not present

## 2022-08-10 DIAGNOSIS — Z6823 Body mass index (BMI) 23.0-23.9, adult: Secondary | ICD-10-CM | POA: Diagnosis not present

## 2022-08-10 DIAGNOSIS — E1169 Type 2 diabetes mellitus with other specified complication: Secondary | ICD-10-CM | POA: Diagnosis not present

## 2022-08-10 DIAGNOSIS — E1159 Type 2 diabetes mellitus with other circulatory complications: Secondary | ICD-10-CM | POA: Diagnosis not present

## 2022-08-10 DIAGNOSIS — R809 Proteinuria, unspecified: Secondary | ICD-10-CM | POA: Diagnosis not present

## 2022-08-12 DIAGNOSIS — E1159 Type 2 diabetes mellitus with other circulatory complications: Secondary | ICD-10-CM | POA: Diagnosis not present

## 2022-08-12 DIAGNOSIS — I10 Essential (primary) hypertension: Secondary | ICD-10-CM | POA: Diagnosis not present

## 2022-08-13 DIAGNOSIS — E1159 Type 2 diabetes mellitus with other circulatory complications: Secondary | ICD-10-CM | POA: Diagnosis not present

## 2022-08-13 DIAGNOSIS — I10 Essential (primary) hypertension: Secondary | ICD-10-CM | POA: Diagnosis not present

## 2022-08-24 DIAGNOSIS — D631 Anemia in chronic kidney disease: Secondary | ICD-10-CM | POA: Diagnosis not present

## 2022-08-24 DIAGNOSIS — N183 Chronic kidney disease, stage 3 unspecified: Secondary | ICD-10-CM | POA: Diagnosis not present

## 2022-09-07 DIAGNOSIS — D631 Anemia in chronic kidney disease: Secondary | ICD-10-CM | POA: Diagnosis not present

## 2022-09-07 DIAGNOSIS — N183 Chronic kidney disease, stage 3 unspecified: Secondary | ICD-10-CM | POA: Diagnosis not present

## 2022-09-13 DIAGNOSIS — E1159 Type 2 diabetes mellitus with other circulatory complications: Secondary | ICD-10-CM | POA: Diagnosis not present

## 2022-09-13 DIAGNOSIS — I10 Essential (primary) hypertension: Secondary | ICD-10-CM | POA: Diagnosis not present

## 2022-09-17 DIAGNOSIS — N189 Chronic kidney disease, unspecified: Secondary | ICD-10-CM | POA: Diagnosis not present

## 2022-09-17 DIAGNOSIS — D631 Anemia in chronic kidney disease: Secondary | ICD-10-CM | POA: Diagnosis not present

## 2022-09-22 DIAGNOSIS — N183 Chronic kidney disease, stage 3 unspecified: Secondary | ICD-10-CM | POA: Diagnosis not present

## 2022-09-22 DIAGNOSIS — D631 Anemia in chronic kidney disease: Secondary | ICD-10-CM | POA: Diagnosis not present

## 2022-09-24 DIAGNOSIS — N189 Chronic kidney disease, unspecified: Secondary | ICD-10-CM | POA: Diagnosis not present

## 2022-09-24 DIAGNOSIS — D631 Anemia in chronic kidney disease: Secondary | ICD-10-CM | POA: Diagnosis not present

## 2022-09-30 DIAGNOSIS — E559 Vitamin D deficiency, unspecified: Secondary | ICD-10-CM | POA: Diagnosis not present

## 2022-09-30 DIAGNOSIS — D631 Anemia in chronic kidney disease: Secondary | ICD-10-CM | POA: Diagnosis not present

## 2022-09-30 DIAGNOSIS — N183 Chronic kidney disease, stage 3 unspecified: Secondary | ICD-10-CM | POA: Diagnosis not present

## 2022-09-30 DIAGNOSIS — E1122 Type 2 diabetes mellitus with diabetic chronic kidney disease: Secondary | ICD-10-CM | POA: Diagnosis not present

## 2022-09-30 DIAGNOSIS — I129 Hypertensive chronic kidney disease with stage 1 through stage 4 chronic kidney disease, or unspecified chronic kidney disease: Secondary | ICD-10-CM | POA: Diagnosis not present

## 2022-10-06 DIAGNOSIS — D631 Anemia in chronic kidney disease: Secondary | ICD-10-CM | POA: Diagnosis not present

## 2022-10-06 DIAGNOSIS — N183 Chronic kidney disease, stage 3 unspecified: Secondary | ICD-10-CM | POA: Diagnosis not present

## 2022-10-07 DIAGNOSIS — J301 Allergic rhinitis due to pollen: Secondary | ICD-10-CM | POA: Diagnosis not present

## 2022-10-07 DIAGNOSIS — J61 Pneumoconiosis due to asbestos and other mineral fibers: Secondary | ICD-10-CM | POA: Diagnosis not present

## 2022-10-07 DIAGNOSIS — J449 Chronic obstructive pulmonary disease, unspecified: Secondary | ICD-10-CM | POA: Diagnosis not present

## 2022-10-13 DIAGNOSIS — I10 Essential (primary) hypertension: Secondary | ICD-10-CM | POA: Diagnosis not present

## 2022-10-13 DIAGNOSIS — E1159 Type 2 diabetes mellitus with other circulatory complications: Secondary | ICD-10-CM | POA: Diagnosis not present

## 2022-10-20 DIAGNOSIS — N183 Chronic kidney disease, stage 3 unspecified: Secondary | ICD-10-CM | POA: Diagnosis not present

## 2022-10-20 DIAGNOSIS — D631 Anemia in chronic kidney disease: Secondary | ICD-10-CM | POA: Diagnosis not present

## 2022-11-03 DIAGNOSIS — D631 Anemia in chronic kidney disease: Secondary | ICD-10-CM | POA: Diagnosis not present

## 2022-11-03 DIAGNOSIS — N183 Chronic kidney disease, stage 3 unspecified: Secondary | ICD-10-CM | POA: Diagnosis not present

## 2022-11-11 DIAGNOSIS — R351 Nocturia: Secondary | ICD-10-CM | POA: Diagnosis not present

## 2022-11-11 DIAGNOSIS — R972 Elevated prostate specific antigen [PSA]: Secondary | ICD-10-CM | POA: Diagnosis not present

## 2022-11-11 DIAGNOSIS — N401 Enlarged prostate with lower urinary tract symptoms: Secondary | ICD-10-CM | POA: Diagnosis not present

## 2022-11-12 DIAGNOSIS — N401 Enlarged prostate with lower urinary tract symptoms: Secondary | ICD-10-CM | POA: Diagnosis not present

## 2022-11-13 DIAGNOSIS — E1159 Type 2 diabetes mellitus with other circulatory complications: Secondary | ICD-10-CM | POA: Diagnosis not present

## 2022-11-13 DIAGNOSIS — I10 Essential (primary) hypertension: Secondary | ICD-10-CM | POA: Diagnosis not present

## 2022-11-18 DIAGNOSIS — N183 Chronic kidney disease, stage 3 unspecified: Secondary | ICD-10-CM | POA: Diagnosis not present

## 2022-11-18 DIAGNOSIS — D631 Anemia in chronic kidney disease: Secondary | ICD-10-CM | POA: Diagnosis not present

## 2022-12-02 DIAGNOSIS — D631 Anemia in chronic kidney disease: Secondary | ICD-10-CM | POA: Diagnosis not present

## 2022-12-02 DIAGNOSIS — N183 Chronic kidney disease, stage 3 unspecified: Secondary | ICD-10-CM | POA: Diagnosis not present

## 2022-12-03 DIAGNOSIS — Z23 Encounter for immunization: Secondary | ICD-10-CM | POA: Diagnosis not present

## 2022-12-07 DIAGNOSIS — E1129 Type 2 diabetes mellitus with other diabetic kidney complication: Secondary | ICD-10-CM | POA: Diagnosis not present

## 2022-12-07 DIAGNOSIS — E1169 Type 2 diabetes mellitus with other specified complication: Secondary | ICD-10-CM | POA: Diagnosis not present

## 2022-12-07 DIAGNOSIS — E1159 Type 2 diabetes mellitus with other circulatory complications: Secondary | ICD-10-CM | POA: Diagnosis not present

## 2022-12-13 DIAGNOSIS — I10 Essential (primary) hypertension: Secondary | ICD-10-CM | POA: Diagnosis not present

## 2022-12-13 DIAGNOSIS — E1159 Type 2 diabetes mellitus with other circulatory complications: Secondary | ICD-10-CM | POA: Diagnosis not present

## 2022-12-14 DIAGNOSIS — E785 Hyperlipidemia, unspecified: Secondary | ICD-10-CM | POA: Diagnosis not present

## 2022-12-14 DIAGNOSIS — R059 Cough, unspecified: Secondary | ICD-10-CM | POA: Diagnosis not present

## 2022-12-14 DIAGNOSIS — E1159 Type 2 diabetes mellitus with other circulatory complications: Secondary | ICD-10-CM | POA: Diagnosis not present

## 2022-12-14 DIAGNOSIS — Z20822 Contact with and (suspected) exposure to covid-19: Secondary | ICD-10-CM | POA: Diagnosis not present

## 2022-12-14 DIAGNOSIS — N1832 Chronic kidney disease, stage 3b: Secondary | ICD-10-CM | POA: Diagnosis not present

## 2022-12-14 DIAGNOSIS — I152 Hypertension secondary to endocrine disorders: Secondary | ICD-10-CM | POA: Diagnosis not present

## 2022-12-14 DIAGNOSIS — E1169 Type 2 diabetes mellitus with other specified complication: Secondary | ICD-10-CM | POA: Diagnosis not present

## 2022-12-16 DIAGNOSIS — D631 Anemia in chronic kidney disease: Secondary | ICD-10-CM | POA: Diagnosis not present

## 2022-12-16 DIAGNOSIS — N183 Chronic kidney disease, stage 3 unspecified: Secondary | ICD-10-CM | POA: Diagnosis not present

## 2022-12-30 DIAGNOSIS — N183 Chronic kidney disease, stage 3 unspecified: Secondary | ICD-10-CM | POA: Diagnosis not present

## 2022-12-30 DIAGNOSIS — Z23 Encounter for immunization: Secondary | ICD-10-CM | POA: Diagnosis not present

## 2022-12-30 DIAGNOSIS — D631 Anemia in chronic kidney disease: Secondary | ICD-10-CM | POA: Diagnosis not present

## 2022-12-31 DIAGNOSIS — Z1330 Encounter for screening examination for mental health and behavioral disorders, unspecified: Secondary | ICD-10-CM | POA: Diagnosis not present

## 2022-12-31 DIAGNOSIS — I129 Hypertensive chronic kidney disease with stage 1 through stage 4 chronic kidney disease, or unspecified chronic kidney disease: Secondary | ICD-10-CM | POA: Diagnosis not present

## 2022-12-31 DIAGNOSIS — E785 Hyperlipidemia, unspecified: Secondary | ICD-10-CM | POA: Diagnosis not present

## 2022-12-31 DIAGNOSIS — E1169 Type 2 diabetes mellitus with other specified complication: Secondary | ICD-10-CM | POA: Diagnosis not present

## 2022-12-31 DIAGNOSIS — E1140 Type 2 diabetes mellitus with diabetic neuropathy, unspecified: Secondary | ICD-10-CM | POA: Diagnosis not present

## 2022-12-31 DIAGNOSIS — N183 Chronic kidney disease, stage 3 unspecified: Secondary | ICD-10-CM | POA: Diagnosis not present

## 2022-12-31 DIAGNOSIS — E1122 Type 2 diabetes mellitus with diabetic chronic kidney disease: Secondary | ICD-10-CM | POA: Diagnosis not present

## 2023-01-11 DIAGNOSIS — R051 Acute cough: Secondary | ICD-10-CM | POA: Diagnosis not present

## 2023-01-11 DIAGNOSIS — R070 Pain in throat: Secondary | ICD-10-CM | POA: Diagnosis not present

## 2023-01-11 DIAGNOSIS — R0981 Nasal congestion: Secondary | ICD-10-CM | POA: Diagnosis not present

## 2023-01-12 DIAGNOSIS — I10 Essential (primary) hypertension: Secondary | ICD-10-CM | POA: Diagnosis not present

## 2023-01-12 DIAGNOSIS — E1159 Type 2 diabetes mellitus with other circulatory complications: Secondary | ICD-10-CM | POA: Diagnosis not present

## 2023-01-13 DIAGNOSIS — E1159 Type 2 diabetes mellitus with other circulatory complications: Secondary | ICD-10-CM | POA: Diagnosis not present

## 2023-01-13 DIAGNOSIS — I10 Essential (primary) hypertension: Secondary | ICD-10-CM | POA: Diagnosis not present

## 2023-01-14 DIAGNOSIS — D631 Anemia in chronic kidney disease: Secondary | ICD-10-CM | POA: Diagnosis not present

## 2023-01-14 DIAGNOSIS — N183 Chronic kidney disease, stage 3 unspecified: Secondary | ICD-10-CM | POA: Diagnosis not present

## 2023-01-15 DIAGNOSIS — D631 Anemia in chronic kidney disease: Secondary | ICD-10-CM | POA: Diagnosis not present

## 2023-01-15 DIAGNOSIS — N183 Chronic kidney disease, stage 3 unspecified: Secondary | ICD-10-CM | POA: Diagnosis not present

## 2023-01-20 DIAGNOSIS — J61 Pneumoconiosis due to asbestos and other mineral fibers: Secondary | ICD-10-CM | POA: Diagnosis not present

## 2023-01-20 DIAGNOSIS — J301 Allergic rhinitis due to pollen: Secondary | ICD-10-CM | POA: Diagnosis not present

## 2023-01-20 DIAGNOSIS — J449 Chronic obstructive pulmonary disease, unspecified: Secondary | ICD-10-CM | POA: Diagnosis not present

## 2023-01-28 DIAGNOSIS — N183 Chronic kidney disease, stage 3 unspecified: Secondary | ICD-10-CM | POA: Diagnosis not present

## 2023-01-28 DIAGNOSIS — D631 Anemia in chronic kidney disease: Secondary | ICD-10-CM | POA: Diagnosis not present

## 2023-02-11 DIAGNOSIS — D631 Anemia in chronic kidney disease: Secondary | ICD-10-CM | POA: Diagnosis not present

## 2023-02-11 DIAGNOSIS — N183 Chronic kidney disease, stage 3 unspecified: Secondary | ICD-10-CM | POA: Diagnosis not present

## 2023-02-12 DIAGNOSIS — E1159 Type 2 diabetes mellitus with other circulatory complications: Secondary | ICD-10-CM | POA: Diagnosis not present

## 2023-02-12 DIAGNOSIS — I10 Essential (primary) hypertension: Secondary | ICD-10-CM | POA: Diagnosis not present

## 2023-02-13 DIAGNOSIS — I10 Essential (primary) hypertension: Secondary | ICD-10-CM | POA: Diagnosis not present

## 2023-02-13 DIAGNOSIS — E1159 Type 2 diabetes mellitus with other circulatory complications: Secondary | ICD-10-CM | POA: Diagnosis not present

## 2023-02-25 DIAGNOSIS — N183 Chronic kidney disease, stage 3 unspecified: Secondary | ICD-10-CM | POA: Diagnosis not present

## 2023-02-25 DIAGNOSIS — D631 Anemia in chronic kidney disease: Secondary | ICD-10-CM | POA: Diagnosis not present

## 2023-03-04 DIAGNOSIS — E119 Type 2 diabetes mellitus without complications: Secondary | ICD-10-CM | POA: Diagnosis not present

## 2023-03-04 DIAGNOSIS — A045 Campylobacter enteritis: Secondary | ICD-10-CM | POA: Diagnosis not present

## 2023-03-04 DIAGNOSIS — I10 Essential (primary) hypertension: Secondary | ICD-10-CM | POA: Diagnosis not present

## 2023-03-04 DIAGNOSIS — R55 Syncope and collapse: Secondary | ICD-10-CM | POA: Diagnosis not present

## 2023-03-04 DIAGNOSIS — J449 Chronic obstructive pulmonary disease, unspecified: Secondary | ICD-10-CM | POA: Diagnosis not present

## 2023-03-04 DIAGNOSIS — R9431 Abnormal electrocardiogram [ECG] [EKG]: Secondary | ICD-10-CM | POA: Diagnosis not present

## 2023-03-04 DIAGNOSIS — Z79891 Long term (current) use of opiate analgesic: Secondary | ICD-10-CM | POA: Diagnosis not present

## 2023-03-11 DIAGNOSIS — I10 Essential (primary) hypertension: Secondary | ICD-10-CM | POA: Diagnosis not present

## 2023-03-11 DIAGNOSIS — E119 Type 2 diabetes mellitus without complications: Secondary | ICD-10-CM | POA: Diagnosis not present

## 2023-03-11 DIAGNOSIS — A045 Campylobacter enteritis: Secondary | ICD-10-CM | POA: Diagnosis not present

## 2023-03-12 DIAGNOSIS — I10 Essential (primary) hypertension: Secondary | ICD-10-CM | POA: Diagnosis not present

## 2023-03-12 DIAGNOSIS — E1159 Type 2 diabetes mellitus with other circulatory complications: Secondary | ICD-10-CM | POA: Diagnosis not present

## 2023-03-13 DIAGNOSIS — I10 Essential (primary) hypertension: Secondary | ICD-10-CM | POA: Diagnosis not present

## 2023-03-13 DIAGNOSIS — E1159 Type 2 diabetes mellitus with other circulatory complications: Secondary | ICD-10-CM | POA: Diagnosis not present

## 2023-03-15 DIAGNOSIS — R55 Syncope and collapse: Secondary | ICD-10-CM | POA: Diagnosis not present

## 2023-03-15 DIAGNOSIS — I10 Essential (primary) hypertension: Secondary | ICD-10-CM | POA: Diagnosis not present

## 2023-03-15 DIAGNOSIS — R42 Dizziness and giddiness: Secondary | ICD-10-CM | POA: Diagnosis not present

## 2023-03-19 DIAGNOSIS — Z136 Encounter for screening for cardiovascular disorders: Secondary | ICD-10-CM | POA: Diagnosis not present

## 2023-03-19 DIAGNOSIS — Z139 Encounter for screening, unspecified: Secondary | ICD-10-CM | POA: Diagnosis not present

## 2023-03-19 DIAGNOSIS — Z1339 Encounter for screening examination for other mental health and behavioral disorders: Secondary | ICD-10-CM | POA: Diagnosis not present

## 2023-03-19 DIAGNOSIS — Z7689 Persons encountering health services in other specified circumstances: Secondary | ICD-10-CM | POA: Diagnosis not present

## 2023-03-19 DIAGNOSIS — K5900 Constipation, unspecified: Secondary | ICD-10-CM | POA: Diagnosis not present

## 2023-03-19 DIAGNOSIS — Z1389 Encounter for screening for other disorder: Secondary | ICD-10-CM | POA: Diagnosis not present

## 2023-03-19 DIAGNOSIS — E1129 Type 2 diabetes mellitus with other diabetic kidney complication: Secondary | ICD-10-CM | POA: Diagnosis not present

## 2023-03-19 DIAGNOSIS — R140 Abdominal distension (gaseous): Secondary | ICD-10-CM | POA: Diagnosis not present

## 2023-03-19 DIAGNOSIS — Z6824 Body mass index (BMI) 24.0-24.9, adult: Secondary | ICD-10-CM | POA: Diagnosis not present

## 2023-03-19 DIAGNOSIS — Z1331 Encounter for screening for depression: Secondary | ICD-10-CM | POA: Diagnosis not present

## 2023-03-19 DIAGNOSIS — Z0000 Encounter for general adult medical examination without abnormal findings: Secondary | ICD-10-CM | POA: Diagnosis not present

## 2023-03-25 DIAGNOSIS — N183 Chronic kidney disease, stage 3 unspecified: Secondary | ICD-10-CM | POA: Diagnosis not present

## 2023-03-25 DIAGNOSIS — D631 Anemia in chronic kidney disease: Secondary | ICD-10-CM | POA: Diagnosis not present

## 2023-03-31 DIAGNOSIS — N189 Chronic kidney disease, unspecified: Secondary | ICD-10-CM | POA: Diagnosis not present

## 2023-03-31 DIAGNOSIS — N183 Chronic kidney disease, stage 3 unspecified: Secondary | ICD-10-CM | POA: Diagnosis not present

## 2023-03-31 DIAGNOSIS — E559 Vitamin D deficiency, unspecified: Secondary | ICD-10-CM | POA: Diagnosis not present

## 2023-03-31 DIAGNOSIS — I129 Hypertensive chronic kidney disease with stage 1 through stage 4 chronic kidney disease, or unspecified chronic kidney disease: Secondary | ICD-10-CM | POA: Diagnosis not present

## 2023-03-31 DIAGNOSIS — D631 Anemia in chronic kidney disease: Secondary | ICD-10-CM | POA: Diagnosis not present

## 2023-03-31 DIAGNOSIS — E1122 Type 2 diabetes mellitus with diabetic chronic kidney disease: Secondary | ICD-10-CM | POA: Diagnosis not present

## 2023-04-06 DIAGNOSIS — Z1330 Encounter for screening examination for mental health and behavioral disorders, unspecified: Secondary | ICD-10-CM | POA: Diagnosis not present

## 2023-04-06 DIAGNOSIS — E1140 Type 2 diabetes mellitus with diabetic neuropathy, unspecified: Secondary | ICD-10-CM | POA: Diagnosis not present

## 2023-04-08 DIAGNOSIS — N183 Chronic kidney disease, stage 3 unspecified: Secondary | ICD-10-CM | POA: Diagnosis not present

## 2023-04-08 DIAGNOSIS — D631 Anemia in chronic kidney disease: Secondary | ICD-10-CM | POA: Diagnosis not present

## 2023-04-12 DIAGNOSIS — E1159 Type 2 diabetes mellitus with other circulatory complications: Secondary | ICD-10-CM | POA: Diagnosis not present

## 2023-04-13 DIAGNOSIS — E538 Deficiency of other specified B group vitamins: Secondary | ICD-10-CM | POA: Diagnosis not present

## 2023-04-13 DIAGNOSIS — I1310 Hypertensive heart and chronic kidney disease without heart failure, with stage 1 through stage 4 chronic kidney disease, or unspecified chronic kidney disease: Secondary | ICD-10-CM | POA: Diagnosis not present

## 2023-04-15 DIAGNOSIS — N183 Chronic kidney disease, stage 3 unspecified: Secondary | ICD-10-CM | POA: Diagnosis not present

## 2023-04-15 DIAGNOSIS — D631 Anemia in chronic kidney disease: Secondary | ICD-10-CM | POA: Diagnosis not present

## 2023-04-28 DIAGNOSIS — R351 Nocturia: Secondary | ICD-10-CM | POA: Diagnosis not present

## 2023-04-28 DIAGNOSIS — R972 Elevated prostate specific antigen [PSA]: Secondary | ICD-10-CM | POA: Diagnosis not present

## 2023-04-28 DIAGNOSIS — N401 Enlarged prostate with lower urinary tract symptoms: Secondary | ICD-10-CM | POA: Diagnosis not present

## 2023-04-29 DIAGNOSIS — N183 Chronic kidney disease, stage 3 unspecified: Secondary | ICD-10-CM | POA: Diagnosis not present

## 2023-04-29 DIAGNOSIS — D631 Anemia in chronic kidney disease: Secondary | ICD-10-CM | POA: Diagnosis not present

## 2023-05-03 DIAGNOSIS — E1159 Type 2 diabetes mellitus with other circulatory complications: Secondary | ICD-10-CM | POA: Diagnosis not present

## 2023-05-03 DIAGNOSIS — E1169 Type 2 diabetes mellitus with other specified complication: Secondary | ICD-10-CM | POA: Diagnosis not present

## 2023-05-03 DIAGNOSIS — E1129 Type 2 diabetes mellitus with other diabetic kidney complication: Secondary | ICD-10-CM | POA: Diagnosis not present

## 2023-05-10 DIAGNOSIS — E1129 Type 2 diabetes mellitus with other diabetic kidney complication: Secondary | ICD-10-CM | POA: Diagnosis not present

## 2023-05-10 DIAGNOSIS — Z6824 Body mass index (BMI) 24.0-24.9, adult: Secondary | ICD-10-CM | POA: Diagnosis not present

## 2023-05-10 DIAGNOSIS — E1159 Type 2 diabetes mellitus with other circulatory complications: Secondary | ICD-10-CM | POA: Diagnosis not present

## 2023-05-10 DIAGNOSIS — E785 Hyperlipidemia, unspecified: Secondary | ICD-10-CM | POA: Diagnosis not present

## 2023-05-10 DIAGNOSIS — E1169 Type 2 diabetes mellitus with other specified complication: Secondary | ICD-10-CM | POA: Diagnosis not present

## 2023-05-10 DIAGNOSIS — I152 Hypertension secondary to endocrine disorders: Secondary | ICD-10-CM | POA: Diagnosis not present

## 2023-05-11 DIAGNOSIS — J301 Allergic rhinitis due to pollen: Secondary | ICD-10-CM | POA: Diagnosis not present

## 2023-05-11 DIAGNOSIS — J61 Pneumoconiosis due to asbestos and other mineral fibers: Secondary | ICD-10-CM | POA: Diagnosis not present

## 2023-05-11 DIAGNOSIS — J449 Chronic obstructive pulmonary disease, unspecified: Secondary | ICD-10-CM | POA: Diagnosis not present

## 2023-05-12 DIAGNOSIS — E1159 Type 2 diabetes mellitus with other circulatory complications: Secondary | ICD-10-CM | POA: Diagnosis not present

## 2023-05-12 DIAGNOSIS — I1310 Hypertensive heart and chronic kidney disease without heart failure, with stage 1 through stage 4 chronic kidney disease, or unspecified chronic kidney disease: Secondary | ICD-10-CM | POA: Diagnosis not present

## 2023-05-13 DIAGNOSIS — N183 Chronic kidney disease, stage 3 unspecified: Secondary | ICD-10-CM | POA: Diagnosis not present

## 2023-05-13 DIAGNOSIS — E538 Deficiency of other specified B group vitamins: Secondary | ICD-10-CM | POA: Diagnosis not present

## 2023-05-13 DIAGNOSIS — I1310 Hypertensive heart and chronic kidney disease without heart failure, with stage 1 through stage 4 chronic kidney disease, or unspecified chronic kidney disease: Secondary | ICD-10-CM | POA: Diagnosis not present

## 2023-05-13 DIAGNOSIS — D631 Anemia in chronic kidney disease: Secondary | ICD-10-CM | POA: Diagnosis not present

## 2023-05-13 DIAGNOSIS — E1159 Type 2 diabetes mellitus with other circulatory complications: Secondary | ICD-10-CM | POA: Diagnosis not present

## 2023-05-27 DIAGNOSIS — D631 Anemia in chronic kidney disease: Secondary | ICD-10-CM | POA: Diagnosis not present

## 2023-05-27 DIAGNOSIS — N183 Chronic kidney disease, stage 3 unspecified: Secondary | ICD-10-CM | POA: Diagnosis not present

## 2023-06-10 DIAGNOSIS — N183 Chronic kidney disease, stage 3 unspecified: Secondary | ICD-10-CM | POA: Diagnosis not present

## 2023-06-10 DIAGNOSIS — D631 Anemia in chronic kidney disease: Secondary | ICD-10-CM | POA: Diagnosis not present

## 2023-06-12 DIAGNOSIS — I1310 Hypertensive heart and chronic kidney disease without heart failure, with stage 1 through stage 4 chronic kidney disease, or unspecified chronic kidney disease: Secondary | ICD-10-CM | POA: Diagnosis not present

## 2023-06-12 DIAGNOSIS — E1159 Type 2 diabetes mellitus with other circulatory complications: Secondary | ICD-10-CM | POA: Diagnosis not present

## 2023-06-13 DIAGNOSIS — I1310 Hypertensive heart and chronic kidney disease without heart failure, with stage 1 through stage 4 chronic kidney disease, or unspecified chronic kidney disease: Secondary | ICD-10-CM | POA: Diagnosis not present

## 2023-06-13 DIAGNOSIS — E538 Deficiency of other specified B group vitamins: Secondary | ICD-10-CM | POA: Diagnosis not present

## 2023-06-24 DIAGNOSIS — J61 Pneumoconiosis due to asbestos and other mineral fibers: Secondary | ICD-10-CM | POA: Diagnosis not present

## 2023-06-24 DIAGNOSIS — I2510 Atherosclerotic heart disease of native coronary artery without angina pectoris: Secondary | ICD-10-CM | POA: Diagnosis not present

## 2023-06-24 DIAGNOSIS — N183 Chronic kidney disease, stage 3 unspecified: Secondary | ICD-10-CM | POA: Diagnosis not present

## 2023-06-24 DIAGNOSIS — K449 Diaphragmatic hernia without obstruction or gangrene: Secondary | ICD-10-CM | POA: Diagnosis not present

## 2023-06-24 DIAGNOSIS — D631 Anemia in chronic kidney disease: Secondary | ICD-10-CM | POA: Diagnosis not present

## 2023-06-24 DIAGNOSIS — J449 Chronic obstructive pulmonary disease, unspecified: Secondary | ICD-10-CM | POA: Diagnosis not present

## 2023-06-28 DIAGNOSIS — J449 Chronic obstructive pulmonary disease, unspecified: Secondary | ICD-10-CM | POA: Diagnosis not present

## 2023-07-08 DIAGNOSIS — D631 Anemia in chronic kidney disease: Secondary | ICD-10-CM | POA: Diagnosis not present

## 2023-07-08 DIAGNOSIS — N183 Chronic kidney disease, stage 3 unspecified: Secondary | ICD-10-CM | POA: Diagnosis not present

## 2023-07-09 DIAGNOSIS — I639 Cerebral infarction, unspecified: Secondary | ICD-10-CM | POA: Diagnosis not present

## 2023-07-09 DIAGNOSIS — Z79899 Other long term (current) drug therapy: Secondary | ICD-10-CM | POA: Diagnosis not present

## 2023-07-09 DIAGNOSIS — Z7984 Long term (current) use of oral hypoglycemic drugs: Secondary | ICD-10-CM | POA: Diagnosis not present

## 2023-07-09 DIAGNOSIS — D631 Anemia in chronic kidney disease: Secondary | ICD-10-CM | POA: Diagnosis not present

## 2023-07-09 DIAGNOSIS — E871 Hypo-osmolality and hyponatremia: Secondary | ICD-10-CM | POA: Diagnosis not present

## 2023-07-09 DIAGNOSIS — R200 Anesthesia of skin: Secondary | ICD-10-CM | POA: Diagnosis not present

## 2023-07-09 DIAGNOSIS — E7800 Pure hypercholesterolemia, unspecified: Secondary | ICD-10-CM | POA: Diagnosis not present

## 2023-07-09 DIAGNOSIS — Z888 Allergy status to other drugs, medicaments and biological substances status: Secondary | ICD-10-CM | POA: Diagnosis not present

## 2023-07-09 DIAGNOSIS — Z87440 Personal history of urinary (tract) infections: Secondary | ICD-10-CM | POA: Diagnosis not present

## 2023-07-09 DIAGNOSIS — Z91041 Radiographic dye allergy status: Secondary | ICD-10-CM | POA: Diagnosis not present

## 2023-07-09 DIAGNOSIS — N1832 Chronic kidney disease, stage 3b: Secondary | ICD-10-CM | POA: Diagnosis not present

## 2023-07-09 DIAGNOSIS — I951 Orthostatic hypotension: Secondary | ICD-10-CM | POA: Diagnosis not present

## 2023-07-09 DIAGNOSIS — N183 Chronic kidney disease, stage 3 unspecified: Secondary | ICD-10-CM | POA: Diagnosis not present

## 2023-07-09 DIAGNOSIS — N400 Enlarged prostate without lower urinary tract symptoms: Secondary | ICD-10-CM | POA: Diagnosis not present

## 2023-07-09 DIAGNOSIS — J449 Chronic obstructive pulmonary disease, unspecified: Secondary | ICD-10-CM | POA: Diagnosis not present

## 2023-07-09 DIAGNOSIS — I6522 Occlusion and stenosis of left carotid artery: Secondary | ICD-10-CM | POA: Diagnosis not present

## 2023-07-09 DIAGNOSIS — Z7902 Long term (current) use of antithrombotics/antiplatelets: Secondary | ICD-10-CM | POA: Diagnosis not present

## 2023-07-09 DIAGNOSIS — Z87891 Personal history of nicotine dependence: Secondary | ICD-10-CM | POA: Diagnosis not present

## 2023-07-09 DIAGNOSIS — I10 Essential (primary) hypertension: Secondary | ICD-10-CM | POA: Diagnosis not present

## 2023-07-09 DIAGNOSIS — R29810 Facial weakness: Secondary | ICD-10-CM | POA: Diagnosis not present

## 2023-07-09 DIAGNOSIS — Z7951 Long term (current) use of inhaled steroids: Secondary | ICD-10-CM | POA: Diagnosis not present

## 2023-07-09 DIAGNOSIS — M1990 Unspecified osteoarthritis, unspecified site: Secondary | ICD-10-CM | POA: Diagnosis not present

## 2023-07-09 DIAGNOSIS — R9431 Abnormal electrocardiogram [ECG] [EKG]: Secondary | ICD-10-CM | POA: Diagnosis not present

## 2023-07-09 DIAGNOSIS — I129 Hypertensive chronic kidney disease with stage 1 through stage 4 chronic kidney disease, or unspecified chronic kidney disease: Secondary | ICD-10-CM | POA: Diagnosis not present

## 2023-07-09 DIAGNOSIS — E1122 Type 2 diabetes mellitus with diabetic chronic kidney disease: Secondary | ICD-10-CM | POA: Diagnosis not present

## 2023-07-10 ENCOUNTER — Inpatient Hospital Stay (HOSPITAL_COMMUNITY)
Admission: EM | Admit: 2023-07-10 | Discharge: 2023-07-14 | DRG: 036 | Disposition: A | Discharge status: Home / Self Care | Source: Other Acute Inpatient Hospital | Attending: Internal Medicine | Admitting: Internal Medicine

## 2023-07-10 ENCOUNTER — Other Ambulatory Visit: Payer: Self-pay

## 2023-07-10 ENCOUNTER — Encounter (HOSPITAL_COMMUNITY): Payer: Self-pay

## 2023-07-10 DIAGNOSIS — Z7951 Long term (current) use of inhaled steroids: Secondary | ICD-10-CM

## 2023-07-10 DIAGNOSIS — I129 Hypertensive chronic kidney disease with stage 1 through stage 4 chronic kidney disease, or unspecified chronic kidney disease: Secondary | ICD-10-CM | POA: Diagnosis present

## 2023-07-10 DIAGNOSIS — Z7902 Long term (current) use of antithrombotics/antiplatelets: Secondary | ICD-10-CM | POA: Diagnosis not present

## 2023-07-10 DIAGNOSIS — E538 Deficiency of other specified B group vitamins: Secondary | ICD-10-CM | POA: Diagnosis not present

## 2023-07-10 DIAGNOSIS — J61 Pneumoconiosis due to asbestos and other mineral fibers: Secondary | ICD-10-CM

## 2023-07-10 DIAGNOSIS — E119 Type 2 diabetes mellitus without complications: Secondary | ICD-10-CM

## 2023-07-10 DIAGNOSIS — N183 Chronic kidney disease, stage 3 unspecified: Secondary | ICD-10-CM

## 2023-07-10 DIAGNOSIS — E1159 Type 2 diabetes mellitus with other circulatory complications: Secondary | ICD-10-CM | POA: Diagnosis not present

## 2023-07-10 DIAGNOSIS — E1122 Type 2 diabetes mellitus with diabetic chronic kidney disease: Secondary | ICD-10-CM | POA: Diagnosis present

## 2023-07-10 DIAGNOSIS — I951 Orthostatic hypotension: Secondary | ICD-10-CM | POA: Diagnosis present

## 2023-07-10 DIAGNOSIS — R29810 Facial weakness: Secondary | ICD-10-CM | POA: Diagnosis present

## 2023-07-10 DIAGNOSIS — I6522 Occlusion and stenosis of left carotid artery: Secondary | ICD-10-CM

## 2023-07-10 DIAGNOSIS — E785 Hyperlipidemia, unspecified: Secondary | ICD-10-CM | POA: Diagnosis present

## 2023-07-10 DIAGNOSIS — N400 Enlarged prostate without lower urinary tract symptoms: Secondary | ICD-10-CM | POA: Diagnosis present

## 2023-07-10 DIAGNOSIS — N1832 Chronic kidney disease, stage 3b: Secondary | ICD-10-CM | POA: Diagnosis present

## 2023-07-10 DIAGNOSIS — J449 Chronic obstructive pulmonary disease, unspecified: Secondary | ICD-10-CM | POA: Diagnosis not present

## 2023-07-10 DIAGNOSIS — I6529 Occlusion and stenosis of unspecified carotid artery: Secondary | ICD-10-CM | POA: Diagnosis present

## 2023-07-10 DIAGNOSIS — G459 Transient cerebral ischemic attack, unspecified: Secondary | ICD-10-CM | POA: Diagnosis not present

## 2023-07-10 DIAGNOSIS — Z7984 Long term (current) use of oral hypoglycemic drugs: Secondary | ICD-10-CM | POA: Diagnosis not present

## 2023-07-10 DIAGNOSIS — Z743 Need for continuous supervision: Secondary | ICD-10-CM | POA: Diagnosis not present

## 2023-07-10 DIAGNOSIS — I6523 Occlusion and stenosis of bilateral carotid arteries: Secondary | ICD-10-CM | POA: Diagnosis not present

## 2023-07-10 DIAGNOSIS — I10 Essential (primary) hypertension: Secondary | ICD-10-CM

## 2023-07-10 DIAGNOSIS — N1831 Chronic kidney disease, stage 3a: Secondary | ICD-10-CM | POA: Diagnosis not present

## 2023-07-10 DIAGNOSIS — E871 Hypo-osmolality and hyponatremia: Secondary | ICD-10-CM | POA: Diagnosis not present

## 2023-07-10 DIAGNOSIS — Z79899 Other long term (current) drug therapy: Secondary | ICD-10-CM | POA: Diagnosis not present

## 2023-07-10 DIAGNOSIS — I1310 Hypertensive heart and chronic kidney disease without heart failure, with stage 1 through stage 4 chronic kidney disease, or unspecified chronic kidney disease: Secondary | ICD-10-CM | POA: Diagnosis not present

## 2023-07-10 DIAGNOSIS — Z886 Allergy status to analgesic agent status: Secondary | ICD-10-CM | POA: Diagnosis not present

## 2023-07-10 DIAGNOSIS — Z888 Allergy status to other drugs, medicaments and biological substances status: Secondary | ICD-10-CM | POA: Diagnosis not present

## 2023-07-10 DIAGNOSIS — R202 Paresthesia of skin: Secondary | ICD-10-CM | POA: Diagnosis not present

## 2023-07-10 HISTORY — DX: Essential (primary) hypertension: I10

## 2023-07-10 HISTORY — DX: Type 2 diabetes mellitus without complications: E11.9

## 2023-07-10 HISTORY — DX: Other complications of anesthesia, initial encounter: T88.59XA

## 2023-07-10 HISTORY — DX: Pneumoconiosis due to asbestos and other mineral fibers: J61

## 2023-07-10 HISTORY — DX: Chronic kidney disease, stage 3 unspecified: N18.30

## 2023-07-10 HISTORY — DX: Occlusion and stenosis of left carotid artery: I65.22

## 2023-07-10 LAB — CREATININE, SERUM
Creatinine, Ser: 1.62 mg/dL — ABNORMAL HIGH (ref 0.61–1.24)
GFR, Estimated: 42 mL/min — ABNORMAL LOW (ref >60)

## 2023-07-10 LAB — HEMOGLOBIN A1C
Hgb A1c MFr Bld: 6.5 % — ABNORMAL HIGH (ref 4.8–5.6)
Mean Plasma Glucose: 139.85 mg/dL

## 2023-07-10 LAB — CBC
HCT: 32.4 % — ABNORMAL LOW (ref 39.0–52.0)
Hemoglobin: 10.4 g/dL — ABNORMAL LOW (ref 13.0–17.0)
MCH: 27.8 pg (ref 26.0–34.0)
MCHC: 32.1 g/dL (ref 30.0–36.0)
MCV: 86.6 fL (ref 80.0–100.0)
Platelets: 217 10*3/uL (ref 150–400)
RBC: 3.74 MIL/uL — ABNORMAL LOW (ref 4.22–5.81)
RDW: 15 % (ref 11.5–15.5)
WBC: 6.2 10*3/uL (ref 4.0–10.5)
nRBC: 0 % (ref 0.0–0.2)

## 2023-07-10 LAB — GLUCOSE, CAPILLARY
Glucose-Capillary: 89 mg/dL (ref 70–99)
Glucose-Capillary: 187 mg/dL — ABNORMAL HIGH (ref 70–99)

## 2023-07-10 MED ORDER — INSULIN ASPART 100 UNIT/ML IJ SOLN
0.0000 [IU] | Freq: Three times a day (TID) | INTRAMUSCULAR | Status: DC
  Administered 2023-07-11: 2 [IU] via SUBCUTANEOUS
  Administered 2023-07-12: 3 [IU] via SUBCUTANEOUS
  Administered 2023-07-14 (×2): 5 [IU] via SUBCUTANEOUS

## 2023-07-10 MED ORDER — STROKE: EARLY STAGES OF RECOVERY BOOK
Freq: Once | Status: AC
  Filled 2023-07-10: qty 1

## 2023-07-10 MED ORDER — ACETAMINOPHEN 650 MG RE SUPP: 650.0000 mg | RECTAL | Status: DC | PRN

## 2023-07-10 MED ORDER — ACETAMINOPHEN 160 MG/5ML PO SOLN: 650.0000 mg | ORAL | Status: DC | PRN

## 2023-07-10 MED ORDER — SODIUM CHLORIDE 0.9 % IV SOLN: INTRAVENOUS | Status: AC

## 2023-07-10 MED ORDER — ACETAMINOPHEN 325 MG PO TABS: 650.0000 mg | ORAL_TABLET | ORAL | Status: DC | PRN

## 2023-07-10 MED ORDER — ENOXAPARIN SODIUM 40 MG/0.4ML IJ SOSY
40.0000 mg | PREFILLED_SYRINGE | INTRAMUSCULAR | Status: DC
  Administered 2023-07-10 – 2023-07-12 (×3): 40 mg via SUBCUTANEOUS
  Filled 2023-07-10 (×3): qty 0.4

## 2023-07-10 MED ORDER — INSULIN ASPART 100 UNIT/ML IJ SOLN
0.0000 [IU] | Freq: Every day | INTRAMUSCULAR | Status: DC
  Administered 2023-07-13: 2 [IU] via SUBCUTANEOUS

## 2023-07-10 NOTE — H&P (Signed)
 History and Physical    Patient: Terry Vasquez FMW:[id number] DOB: Feb 28, 1940 DOA: 07/10/2023 DOS: the patient was seen and examined on 07/10/2023 PCP: Trinidad Glisson, MD  Patient coming from: Home  Chief Complaint: No chief complaint on file.  HPI: Kaiden Pech is a 83 y.o. male with medical history significant of COPD, type 2 diabetes, left carotid stenosis on Plavix, history of inguinal hernia, essential hypertension, asbestosis, chronic kidney disease stage IIIb with baseline creatinine 1.3-1.8 who was transferred from [facility] where he was seen with facial droop on the right.  Patient presented with code stroke.  Workup done included CT head, CTA and MRI.  CT and MRI showed no acute stroke.  Patient however has CTA that that showed high-grade left carotid stenosis.  Discussed case with teleneurologist.  It appears the carotid stenosis has progressed to critical level.  Vascular surgery therefore was consulted and Dr. Pearline agreed to have patient transferred to [facility] with vascular surgery will evaluate.  Patient admitted to the medical service with vascular consultations.  He is asymptomatic at the moment.  Symptoms have resolved.  Patient is fully awake alert.  No other complaints.  No other weakness.  Review of Systems: As mentioned in the history of present illness. All other systems reviewed and are negative. Past Medical History:  Diagnosis Date   COPD (chronic obstructive pulmonary disease) (HCC)    Diabetes mellitus without complication (HCC)    Hypertension    Inguinal hernia    Past Surgical History:  Procedure Laterality Date   APPENDECTOMY     INGUINAL HERNIA REPAIR     SHOULDER SURGERY Right    Social History:  reports that he has never smoked. He has never used smokeless tobacco. He reports that he does not drink alcohol  and does not use drugs.  Allergies  Allergen Reactions   Actos [Pioglitazone]     Hypoglycemia, dehydration per patient     Glipizide     Hypoglycemia    Janumet [Sitagliptin Phos-Metformin Hcl]     Lowered blood sugar   Jardiance [Empagliflozin]     Lower blood sugar   Metformin And Related    Motrin [Ibuprofen]     causes me to pass out    Other     Darvocet    No family history on file.  Prior to Admission medications   Medication Sig Start Date End Date Taking? Authorizing Provider  amLODipine (NORVASC) 10 MG tablet 10 mg daily. 09/29/17   [provider]  clopidogrel (PLAVIX) 75 MG tablet 75 mg daily. 12/31/17   [provider]  cyanocobalamin (,VITAMIN B-12,) 1000 MCG/ML injection Inject into the muscle every 30 (thirty) days.    [provider]  doxazosin (CARDURA) 4 MG tablet Take 4 mg by mouth daily.    [provider]  finasteride (PROSCAR) 5 MG tablet 5 mg daily. 12/29/17   [provider]  Fluticasone-Salmeterol (ADVAIR) 250-50 MCG/DOSE AEPB Inhale into the lungs 2 (two) times daily.    [provider]  hydrALAZINE (APRESOLINE) 10 MG tablet 10 mg 2 (two) times daily. 02/01/18   [provider]  lisinopril (PRINIVIL,ZESTRIL) 10 MG tablet 10 mg daily. 02/07/18   [provider]  Omega-3 1000 MG CAPS Take 1,000 mg by mouth daily.    [provider]  ONGLYZA 5 MG TABS tablet 5 mg daily. 11/28/17   [provider]  rosuvastatin (CRESTOR) 10 MG tablet 10 mg daily. 11/16/17   [provider]    Physical Exam: Vitals:   07/10/23 1747  BP: (!) 163/95  Pulse: 76  Resp: 15  Temp: 98.3 F (36.8 C)  TempSrc: Oral  SpO2: 99%  Weight: 71.7 kg  Height: 5' 9 (1.753 m)   Constitutional: NAD, calm, comfortable Eyes: PERRL, lids and conjunctivae normal ENMT: Mucous membranes are moist. Posterior pharynx clear of any exudate or lesions.Normal dentition.  Neck: Left carotid bruit, normal, supple, no masses, no thyromegaly Respiratory: clear to auscultation bilaterally, no wheezing, no crackles.  Normal respiratory effort. No accessory muscle use.  Cardiovascular: Regular rate and rhythm, no murmurs / rubs / gallops. No extremity edema. 2+ pedal pulses. No carotid bruits.  Abdomen: no tenderness, no masses palpated. No hepatosplenomegaly. Bowel sounds positive.  Musculoskeletal: Good range of motion, no joint swelling or tenderness, Skin: no rashes, lesions, ulcers. No induration Neurologic: CN 2-12 grossly intact. Sensation intact, DTR normal. Strength 5/5 in all 4.  Psychiatric: Normal judgment and insight. Alert and oriented x 3. Normal mood  Data Reviewed:  There are no new results to review at this time.  Assessment and Plan:  #1 severe left carotid stenosis: Patient admitted to medical service.  Vascular surgery to follow.  Patient may require left carotid endarterectomy.  He has had TIA at this point.  Will continue with statin.  He is on Plavix.  Will defer to vascular surgery whether we should continue Plavix at this point with surgery possibly planned.  #2 TIA: Most likely this factor is a carotid stenosis.  Will still complete workup including echocardiogram.  Continue statin.  #3 essential hypertension: Will confirm and resume home regimen  #4 hyperlipidemia: Continue statin  #5 COPD: No acute exacerbation.  Continue to monitor  #6 chronic kidney disease stage IIIb: Renal function at baseline from [place].  Continue to monitor    Advance Care Planning:   Code Status: Full Code   Consults: Vascular surgery Dr. Pearline  Family Communication: No family at bedside  Severity of Illness: The appropriate patient status for this patient is INPATIENT. Inpatient status is judged to be reasonable and necessary in order to provide the required intensity of service to ensure the patient's safety. The patient's presenting symptoms, physical exam findings, and initial radiographic and laboratory data in the context of their chronic comorbidities is felt to place them at high  risk for further clinical deterioration. Furthermore, it is not anticipated that the patient will be medically stable for discharge from the hospital within 2 midnights of admission.   * I certify that at the point of admission it is my clinical judgment that the patient will require inpatient hospital care spanning beyond 2 midnights from the point of admission due to high intensity of service, high risk for further deterioration and high frequency of surveillance required.*  AuthorBETHA SIM KNOLL, MD 07/10/2023 8:08 PM  For on call review [email or web address].

## 2023-07-11 ENCOUNTER — Encounter (HOSPITAL_COMMUNITY)

## 2023-07-11 ENCOUNTER — Encounter (HOSPITAL_COMMUNITY): Payer: Self-pay | Admitting: Internal Medicine

## 2023-07-11 DIAGNOSIS — I6522 Occlusion and stenosis of left carotid artery: Secondary | ICD-10-CM | POA: Diagnosis not present

## 2023-07-11 DIAGNOSIS — J449 Chronic obstructive pulmonary disease, unspecified: Secondary | ICD-10-CM

## 2023-07-11 DIAGNOSIS — I6523 Occlusion and stenosis of bilateral carotid arteries: Secondary | ICD-10-CM

## 2023-07-11 DIAGNOSIS — N183 Chronic kidney disease, stage 3 unspecified: Secondary | ICD-10-CM

## 2023-07-11 DIAGNOSIS — G459 Transient cerebral ischemic attack, unspecified: Secondary | ICD-10-CM | POA: Diagnosis not present

## 2023-07-11 DIAGNOSIS — R29810 Facial weakness: Secondary | ICD-10-CM

## 2023-07-11 DIAGNOSIS — I10 Essential (primary) hypertension: Secondary | ICD-10-CM

## 2023-07-11 DIAGNOSIS — E119 Type 2 diabetes mellitus without complications: Secondary | ICD-10-CM

## 2023-07-11 LAB — GLUCOSE, CAPILLARY
Glucose-Capillary: 118 mg/dL — ABNORMAL HIGH (ref 70–99)
Glucose-Capillary: 135 mg/dL — ABNORMAL HIGH (ref 70–99)
Glucose-Capillary: 138 mg/dL — ABNORMAL HIGH (ref 70–99)
Glucose-Capillary: 118 mg/dL — ABNORMAL HIGH (ref 70–99)

## 2023-07-11 LAB — LIPID PANEL
Cholesterol: 104 mg/dL (ref 0–200)
HDL: 36 mg/dL — ABNORMAL LOW (ref >40)
LDL Cholesterol: 53 mg/dL (ref 0–99)
Total CHOL/HDL Ratio: 2.9 ratio
Triglycerides: 77 mg/dL (ref <150)
VLDL: 15 mg/dL (ref 0–40)

## 2023-07-11 MED ORDER — CLOPIDOGREL BISULFATE 75 MG PO TABS
75.0000 mg | ORAL_TABLET | Freq: Every day | ORAL | Status: DC
  Administered 2023-07-11 – 2023-07-14 (×4): 75 mg via ORAL
  Filled 2023-07-11 (×4): qty 1

## 2023-07-11 MED ORDER — ROSUVASTATIN CALCIUM 5 MG PO TABS
10.0000 mg | ORAL_TABLET | Freq: Every day | ORAL | Status: DC
  Administered 2023-07-11 – 2023-07-14 (×4): 10 mg via ORAL
  Filled 2023-07-11 (×4): qty 2

## 2023-07-11 MED ORDER — HYDRALAZINE HCL 25 MG PO TABS: 25.0000 mg | ORAL_TABLET | Freq: Four times a day (QID) | ORAL | Status: DC | PRN

## 2023-07-11 MED ORDER — ASPIRIN 81 MG PO TBEC
81.0000 mg | DELAYED_RELEASE_TABLET | Freq: Every day | ORAL | Status: DC
  Administered 2023-07-11 – 2023-07-14 (×4): 81 mg via ORAL
  Filled 2023-07-11 (×4): qty 1

## 2023-07-11 MED ORDER — FINASTERIDE 5 MG PO TABS
5.0000 mg | ORAL_TABLET | Freq: Every day | ORAL | Status: DC
  Administered 2023-07-11 – 2023-07-14 (×4): 5 mg via ORAL
  Filled 2023-07-11 (×4): qty 1

## 2023-07-11 MED ORDER — CALCIUM CARBONATE ANTACID 500 MG PO CHEW: 1.0000 | CHEWABLE_TABLET | Freq: Three times a day (TID) | ORAL | Status: DC | PRN
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
## 2023-07-11 NOTE — Plan of Care (Signed)
 Problem: Education: Goal: Knowledge of General Education information will improve Description: Including pain rating scale, medication(s)/side effects and non-pharmacologic comfort measures 07/11/2023 1138 by Rennie Laneta BROCKS, RN Outcome: Progressing 07/11/2023 1137 by Rennie Laneta BROCKS, RN Outcome: Progressing   Problem: Health Behavior/Discharge Planning: Goal: Ability to manage health-related needs will improve 07/11/2023 1138 by Rennie Laneta BROCKS, RN Outcome: Progressing 07/11/2023 1137 by Rennie Laneta BROCKS, RN Outcome: Progressing   Problem: Clinical Measurements: Goal: Ability to maintain clinical measurements within normal limits will improve 07/11/2023 1138 by Rennie Laneta BROCKS, RN Outcome: Progressing 07/11/2023 1137 by Rennie Laneta BROCKS, RN Outcome: Progressing Goal: Will remain free from infection 07/11/2023 1138 by Rennie Laneta BROCKS, RN Outcome: Progressing 07/11/2023 1137 by Rennie Laneta BROCKS, RN Outcome: Progressing Goal: Diagnostic test results will improve 07/11/2023 1138 by Rennie Laneta BROCKS, RN Outcome: Progressing 07/11/2023 1137 by Rennie Laneta BROCKS, RN Outcome: Progressing Goal: Respiratory complications will improve 07/11/2023 1138 by Rennie Laneta BROCKS, RN Outcome: Progressing 07/11/2023 1137 by Rennie Laneta BROCKS, RN Outcome: Progressing Goal: Cardiovascular complication will be avoided 07/11/2023 1138 by Rennie Laneta BROCKS, RN Outcome: Progressing 07/11/2023 1137 by Rennie Laneta BROCKS, RN Outcome: Progressing   Problem: Activity: Goal: Risk for activity intolerance will decrease 07/11/2023 1138 by Rennie Laneta BROCKS, RN Outcome: Progressing 07/11/2023 1137 by Rennie Laneta BROCKS, RN Outcome: Progressing   Problem: Nutrition: Goal: Adequate nutrition will be maintained 07/11/2023 1138 by Rennie Laneta BROCKS, RN Outcome: Progressing 07/11/2023 1137 by Rennie Laneta BROCKS, RN Outcome: Progressing   Problem: Coping: Goal: Level of anxiety will decrease 07/11/2023  1138 by Rennie Laneta BROCKS, RN Outcome: Progressing 07/11/2023 1137 by Rennie Laneta BROCKS, RN Outcome: Progressing   Problem: Elimination: Goal: Will not experience complications related to bowel motility 07/11/2023 1138 by Rennie Laneta BROCKS, RN Outcome: Progressing 07/11/2023 1137 by Rennie Laneta BROCKS, RN Outcome: Progressing Goal: Will not experience complications related to urinary retention 07/11/2023 1138 by Rennie Laneta BROCKS, RN Outcome: Progressing 07/11/2023 1137 by Rennie Laneta BROCKS, RN Outcome: Progressing   Problem: Pain Managment: Goal: General experience of comfort will improve and/or be controlled 07/11/2023 1138 by Rennie Laneta BROCKS, RN Outcome: Progressing 07/11/2023 1137 by Rennie Laneta BROCKS, RN Outcome: Progressing   Problem: Safety: Goal: Ability to remain free from injury will improve 07/11/2023 1138 by Rennie Laneta BROCKS, RN Outcome: Progressing 07/11/2023 1137 by Rennie Laneta BROCKS, RN Outcome: Progressing   Problem: Skin Integrity: Goal: Risk for impaired skin integrity will decrease 07/11/2023 1138 by Rennie Laneta BROCKS, RN Outcome: Progressing 07/11/2023 1137 by Rennie Laneta BROCKS, RN Outcome: Progressing   Problem: Education: Goal: Knowledge of disease or condition will improve 07/11/2023 1138 by Rennie Laneta BROCKS, RN Outcome: Progressing 07/11/2023 1137 by Rennie Laneta BROCKS, RN Outcome: Progressing Goal: Knowledge of secondary prevention will improve (MUST DOCUMENT ALL) 07/11/2023 1138 by Rennie Laneta BROCKS, RN Outcome: Progressing 07/11/2023 1137 by Rennie Laneta BROCKS, RN Outcome: Progressing Goal: Knowledge of patient specific risk factors will improve (DELETE if not current risk factor) 07/11/2023 1138 by Rennie Laneta BROCKS, RN Outcome: Progressing 07/11/2023 1137 by Rennie Laneta BROCKS, RN Outcome: Progressing   Problem: Ischemic Stroke/TIA Tissue Perfusion: Goal: Complications of ischemic stroke/TIA will be minimized 07/11/2023 1138 by Rennie Laneta BROCKS,  RN Outcome: Progressing 07/11/2023 1137 by Rennie Laneta BROCKS, RN Outcome: Progressing   Problem: Coping: Goal: Will verbalize positive feelings about self 07/11/2023 1138 by Rennie Laneta BROCKS, RN Outcome: Progressing 07/11/2023 1137 by Rennie Laneta BROCKS, RN Outcome: Progressing Goal: Will identify appropriate support needs 07/11/2023  1138 by Rennie Laneta BROCKS, RN Outcome: Progressing 07/11/2023 1137 by Rennie Laneta BROCKS, RN Outcome: Progressing   Problem: Health Behavior/Discharge Planning: Goal: Ability to manage health-related needs will improve 07/11/2023 1138 by Rennie Laneta BROCKS, RN Outcome: Progressing 07/11/2023 1137 by Rennie Laneta BROCKS, RN Outcome: Progressing Goal: Goals will be collaboratively established with patient/family 07/11/2023 1138 by Rennie Laneta BROCKS, RN Outcome: Progressing 07/11/2023 1137 by Rennie Laneta BROCKS, RN Outcome: Progressing   Problem: Self-Care: Goal: Ability to participate in self-care as condition permits will improve 07/11/2023 1138 by Rennie Laneta BROCKS, RN Outcome: Progressing 07/11/2023 1137 by Rennie Laneta BROCKS, RN Outcome: Progressing Goal: Verbalization of feelings and concerns over difficulty with self-care will improve 07/11/2023 1138 by Rennie Laneta BROCKS, RN Outcome: Progressing 07/11/2023 1137 by Rennie Laneta BROCKS, RN Outcome: Progressing Goal: Ability to communicate needs accurately will improve 07/11/2023 1138 by Rennie Laneta BROCKS, RN Outcome: Progressing 07/11/2023 1137 by Rennie Laneta BROCKS, RN Outcome: Progressing   Problem: Nutrition: Goal: Risk of aspiration will decrease 07/11/2023 1138 by Rennie Laneta BROCKS, RN Outcome: Progressing 07/11/2023 1137 by Rennie Laneta BROCKS, RN Outcome: Progressing Goal: Dietary intake will improve 07/11/2023 1138 by Rennie Laneta BROCKS, RN Outcome: Progressing 07/11/2023 1137 by Rennie Laneta BROCKS, RN Outcome: Progressing   Problem: Education: Goal: Knowledge of the prescribed therapeutic regimen  will improve 07/11/2023 1138 by Rennie Laneta BROCKS, RN Outcome: Progressing 07/11/2023 1137 by Rennie Laneta BROCKS, RN Outcome: Progressing   Problem: Activity: Goal: Ability to tolerate increased activity will improve 07/11/2023 1138 by Rennie Laneta BROCKS, RN Outcome: Progressing 07/11/2023 1137 by Rennie Laneta BROCKS, RN Outcome: Progressing   Problem: Health Behavior/Discharge Planning: Goal: Identification of resources available to assist in meeting health care needs will improve 07/11/2023 1138 by Rennie Laneta BROCKS, RN Outcome: Progressing 07/11/2023 1137 by Rennie Laneta BROCKS, RN Outcome: Progressing   Problem: Nutrition: Goal: Maintenance of adequate nutrition will improve 07/11/2023 1138 by Rennie Laneta BROCKS, RN Outcome: Progressing 07/11/2023 1137 by Rennie Laneta BROCKS, RN Outcome: Progressing   Problem: Clinical Measurements: Goal: Complications related to the disease process, condition or treatment will be avoided or minimized 07/11/2023 1138 by Rennie Laneta BROCKS, RN Outcome: Progressing 07/11/2023 1137 by Rennie Laneta BROCKS, RN Outcome: Progressing   Problem: Respiratory: Goal: Will regain and/or maintain adequate ventilation 07/11/2023 1138 by Rennie Laneta BROCKS, RN Outcome: Progressing 07/11/2023 1137 by Rennie Laneta BROCKS, RN Outcome: Progressing   Problem: Skin Integrity: Goal: Demonstration of wound healing without infection will improve 07/11/2023 1138 by Rennie Laneta BROCKS, RN Outcome: Progressing 07/11/2023 1137 by Rennie Laneta BROCKS, RN Outcome: Progressing   Problem: Education: Goal: Ability to describe self-care measures that may prevent or decrease complications (Diabetes Survival Skills Education) will improve 07/11/2023 1138 by Rennie Laneta BROCKS, RN Outcome: Progressing 07/11/2023 1137 by Rennie Laneta BROCKS, RN Outcome: Progressing Goal: Individualized Educational Video(s) 07/11/2023 1138 by Rennie Laneta BROCKS, RN Outcome: Progressing 07/11/2023 1137 by Rennie Laneta BROCKS, RN Outcome: Progressing   Problem: Coping: Goal: Ability to adjust to condition or change in health will improve 07/11/2023 1138 by Rennie Laneta BROCKS, RN Outcome: Progressing 07/11/2023 1137 by Rennie Laneta BROCKS, RN Outcome: Progressing   Problem: Fluid Volume: Goal: Ability to maintain a balanced intake and output will improve 07/11/2023 1138 by Rennie Laneta BROCKS, RN Outcome: Progressing 07/11/2023 1137 by Rennie Laneta BROCKS, RN Outcome: Progressing   Problem: Health Behavior/Discharge Planning: Goal: Ability to identify and utilize available resources and services will improve 07/11/2023 1138 by Rennie Laneta BROCKS, RN Outcome:  Progressing 07/11/2023 1137 by Rennie Laneta BROCKS, RN Outcome: Progressing Goal: Ability to manage health-related needs will improve 07/11/2023 1138 by Rennie Laneta BROCKS, RN Outcome: Progressing 07/11/2023 1137 by Rennie Laneta BROCKS, RN Outcome: Progressing   Problem: Metabolic: Goal: Ability to maintain appropriate glucose levels will improve 07/11/2023 1138 by Rennie Laneta BROCKS, RN Outcome: Progressing 07/11/2023 1137 by Rennie Laneta BROCKS, RN Outcome: Progressing   Problem: Nutritional: Goal: Maintenance of adequate nutrition will improve 07/11/2023 1138 by Rennie Laneta BROCKS, RN Outcome: Progressing 07/11/2023 1137 by Rennie Laneta BROCKS, RN Outcome: Progressing Goal: Progress toward achieving an optimal weight will improve 07/11/2023 1138 by Rennie Laneta BROCKS, RN Outcome: Progressing 07/11/2023 1137 by Rennie Laneta BROCKS, RN Outcome: Progressing   Problem: Skin Integrity: Goal: Risk for impaired skin integrity will decrease 07/11/2023 1138 by Rennie Laneta BROCKS, RN Outcome: Progressing 07/11/2023 1137 by Rennie Laneta BROCKS, RN Outcome: Progressing

## 2023-07-11 NOTE — Consult Note (Signed)
 Hospital Consult    Reason for Consult:  carotid stenosis, R facial droop  MRN #:  [id number]  History of Present Illness: This is a 83 y.o. male who was transferred from [facility] for possible TIA in the setting of carotid disease.  He presented to [facility] with a right-sided facial droop yesterday.  During this episode he denies any other symptoms.  Specifically he denies any upper or lower extremity weakness or numbness.  He denies any speech issues.  He denies any amaurosis.  He also now has any previous neurological episodes or events.  He has not had any neck surgery or radiation.  He does have a significant history of COPD, diabetes, hypertension and CKD stage III.  Past Medical History:  Diagnosis Date   COPD (chronic obstructive pulmonary disease) (HCC)    Diabetes mellitus without complication (HCC)    Hypertension    Inguinal hernia     Past Surgical History:  Procedure Laterality Date   APPENDECTOMY     INGUINAL HERNIA REPAIR     SHOULDER SURGERY Right     Allergies  Allergen Reactions   Motrin [Ibuprofen] Other (See Comments)    Nosebleed, loss of consiousness   Actos [Pioglitazone] Other (See Comments)    Hypoglycemia, Dehydration    Glipizide Other (See Comments)    Hypoglycemia, Dehydration     Janumet [Sitagliptin Phos-Metformin Hcl] Other (See Comments)    Hypoglycemia, Dehydration     Jardiance [Empagliflozin] Other (See Comments)    Hypoglycemia, Dehydration     Metformin And Related Other (See Comments)    Reaction type/severity unknown    Prior to Admission medications   Medication Sig Start Date End Date Taking? Authorizing Provider  albuterol  (VENTOLIN  HFA) 108 (90 Base) MCG/ACT inhaler Inhale 1-2 puffs into the lungs every 6 (six) hours as needed for wheezing or shortness of breath. 06/15/23  Yes [provider]  bumetanide (BUMEX) 0.5 MG tablet Take 0.5 mg by mouth daily.   Yes [provider]  CALCIUM PO  Take 1 tablet by mouth daily. Strength unknown   Yes [provider]  cholecalciferol (VITAMIN D3) 10 MCG (400 UNIT) TABS tablet Take 400 Units by mouth daily.   Yes [provider]  clopidogrel (PLAVIX) 75 MG tablet Take 75 mg by mouth daily. 12/31/17  Yes [provider]  doxazosin (CARDURA) 4 MG tablet Take 4 mg by mouth daily.   Yes [provider]  finasteride (PROSCAR) 5 MG tablet Take 5 mg by mouth daily. 12/29/17  Yes [provider]  fluticasone furoate-vilanterol (BREO ELLIPTA) 200-25 MCG/ACT AEPB Inhale 1 puff into the lungs daily. 05/17/23  Yes [provider]  JANUVIA 50 MG tablet Take 50 mg by mouth daily.   Yes [provider]  lisinopril (ZESTRIL) 5 MG tablet Take 5 mg by mouth 2 (two) times daily. 02/07/18  Yes [provider]  nateglinide (STARLIX) 120 MG tablet Take 60 mg by mouth 3 (three) times daily with meals.   Yes [provider]  Omega-3 1000 MG CAPS Take 1,000 mg by mouth daily.   Yes [provider]  rosuvastatin (CRESTOR) 10 MG tablet 10 mg daily. 11/16/17  Yes [provider]    Social History   Socioeconomic History   Marital status: Married    Spouse name: Not on file   Number of children: Not on file   Years of education: Not on file   Highest education level: Not on file  Occupational History   Not on file  Tobacco Use   Smoking status: Never   Smokeless tobacco: Never  Substance and Sexual Activity   Alcohol  use: Never   Drug use: Never   Sexual activity: Not on file  Other Topics Concern   Not on file  Social History Narrative   Not on file   Social Drivers of Health   Financial Resource Strain: Not on file  Food Insecurity: No Food Insecurity (07/10/2023)   Hunger Vital Sign    Worried About Running Out of Food in the Last Year: Never true    Ran Out of Food in the Last Year: Never true  Transportation Needs: No Transportation Needs (07/10/2023)    PRAPARE - Administrator, Civil Service (Medical): No    Lack of Transportation (Non-Medical): No  Physical Activity: Not on file  Stress: Not on file  Social Connections: Socially Integrated (07/10/2023)   Social Connection and Isolation Panel    Frequency of Communication with Friends and Family: More than three times a week    Frequency of Social Gatherings with Friends and Family: More than three times a week    Attends Religious Services: More than 4 times per year    Active Member of [employer] or Organizations: Yes    Attends Banker Meetings: More than 4 times per year    Marital Status: Married  Catering manager Violence: Not At Risk (07/10/2023)   Humiliation, Afraid, Rape, and Kick questionnaire    Fear of Current or Ex-Partner: No    Emotionally Abused: No    Physically Abused: No    Sexually Abused: No    No family history on file.  ROS: Otherwise negative unless mentioned in HPI  Physical Examination  Vitals:   07/10/23 2332 07/11/23 0429  BP: (!) 172/60 (!) 147/60  Pulse: 70 82  Resp: 18 18  Temp: (!) 97.5 F (36.4 C) 98.7 F (37.1 C)  SpO2: 99% 96%   Body mass index is 23.33 kg/m.  General: no acute distress Cardiac: hemodynamically stable Pulm: normal work of breathing Neuro: alert, no focal deficit, moving all 4 extremities spontaneously, face symmetric  Extremities: no edema or cyanosis Vascular:   Right: palpable radial  Left: palpable radial    Data:   CTA independently reviewed R carotid bulb and proximal ICA with calcific disease but only mild 1-25% stenosis L carotid bulb and proximal ICA with calcific disease, stenosis 70-80% stenosis   ASSESSMENT/PLAN: This is a 83 y.o. male right-sided facial droop, possible TIA.  His CTA was positive for carotid disease which has been known and under surveillance, MRI was negative for stroke.  Given that this was a right-sided facial droop the right carotid is only 1 to 25%  typically would not offer any intervention unless the lesion was greater than 50% stenosis symptomatic patient.  The left carotid is significantly more diseased, upwards of 70% and therefore would like neurology to help assist with diagnosis of TIA and offer insight on laterality.  Will also obtain a carotid duplex to further assess the level of stenosis.   Norman GORMAN Serve MD [facility] [phone] 07/11/2023  7:35 AM

## 2023-07-11 NOTE — Progress Notes (Signed)
 OT Cancellation Note  Patient Details Name: Terry Vasquez MRN: [id number] DOB: [birth year]   Cancelled Treatment:    Reason Eval/Treat Not Completed: OT screened, no needs identified, will sign off. Per PT at baseline of ind/mod I for ADLs, no acute OT needs. OT is signing off on this pt.   Tuan Tippin C, OT  Acute Rehabilitation Services Office [phone] Secure chat preferred   Adrianne GORMAN Savers 07/11/2023, 1:19 PM

## 2023-07-11 NOTE — Evaluation (Signed)
 Physical Therapy Evaluation Patient Details Name: Terry Vasquez MRN: [id number] DOB: 1940-06-18 Today's Date: 07/11/2023  History of Present Illness  Pt is a 83 y.o male transferred from [facility] for signs of stroke, workup negative. CTA showed high grade L carotid stenosis   PMH: COPD, DM,  HTN, CKD  Clinical Impression  Pt is presenting close to baseline level of functioning. Currently pt is ind to Mod I for all functional activities. Pt currently is orthostatic in standing and reports that this has been going on for the past 2 years and has been monitored by his family doctor. Pt did not require any AD this session and denies falls in the past 6 months. Currently pt is presenting at baseline level of functioning and no skilled physical therapy services recommended. Pt will be discharged from skilled physical therapy services at this time; please re-consult if further needs arise.          If plan is discharge home, recommend the following: Other (comment) (as needed)     Equipment Recommendations None recommended by PT     Functional Status Assessment Patient has not had a recent decline in their functional status     Precautions / Restrictions Precautions Precautions: Fall Precaution/Restrictions Comments: orthostatic Restrictions Weight Bearing Restrictions Per Provider Order: No      Mobility  Bed Mobility Overal bed mobility: Independent      Transfers Overall transfer level: Modified independent Equipment used: None       General transfer comment: performed 3x during session sit to stand and 2x step pivot transfer    Ambulation/Gait Ambulation/Gait assistance: Modified independent (Device/Increase time) Gait Distance (Feet): 20 Feet Assistive device: None, IV Pole Gait Pattern/deviations: WFL(Within Functional Limits) Gait velocity: slightly decreased Gait velocity interpretation: >2.62 ft/sec, indicative of community ambulatory          Balance Overall balance assessment: Modified Independent         Pertinent Vitals/Pain Pain Assessment Pain Assessment: No/denies pain    Home Living Family/patient expects to be discharged to:: Private residence Living Arrangements: Spouse/significant other Available Help at Discharge: Family;Available 24 hours/day Type of Home: House Home Access: Level entry       Home Layout: One level Home Equipment: Cane - single point;Grab bars - tub/shower      Prior Function Prior Level of Function : Independent/Modified Independent;Driving             Mobility Comments: denies use of AD or falls. ADLs Comments: Pt is ind with ADL's and IADL's.     Extremity/Trunk Assessment   Upper Extremity Assessment Upper Extremity Assessment: Overall WFL for tasks assessed    Lower Extremity Assessment Lower Extremity Assessment: Overall WFL for tasks assessed    Cervical / Trunk Assessment Cervical / Trunk Assessment: Normal  Communication   Communication Communication: No apparent difficulties    Cognition Arousal: Alert Behavior During Therapy: WFL for tasks assessed/performed   PT - Cognitive impairments: No apparent impairments       Following commands: Intact       Cueing Cueing Techniques: Verbal cues     General Comments General comments (skin integrity, edema, etc.): pt reports he feels that he is at baseline. Supine 5 min BP: 178/71, Sitting: 186/74 sitting 3 min 186/68 , Standing: 152/70 standing 3 min: 181/68 Pt reports that his baseline is increased BP in supine and a drop in sitting/standing. He takes his BP at home that is monitored by MD office for over 2  years        Assessment/Plan    PT Assessment Patient does not need any further PT services         PT Goals (Current goals can be found in the Care Plan section)  Acute Rehab PT Goals PT Goal Formulation: All assessment and education complete, DC therapy            AM-PAC PT 6  Clicks Mobility  Outcome Measure Help needed turning from your back to your side while in a flat bed without using bedrails?: None Help needed moving from lying on your back to sitting on the side of a flat bed without using bedrails?: None Help needed moving to and from a bed to a chair (including a wheelchair)?: None Help needed standing up from a chair using your arms (e.g., wheelchair or bedside chair)?: None Help needed to walk in hospital room?: None Help needed climbing 3-5 steps with a railing? : A Little 6 Click Score: 23    End of Session Equipment Utilized During Treatment: Gait belt Activity Tolerance: Patient tolerated treatment well Patient left: in chair;with call bell/phone within reach Nurse Communication: Mobility status      Time: 8955-8891 PT Time Calculation (min) (ACUTE ONLY): 24 min   Charges:   PT Evaluation $PT Eval Low Complexity: 1 Low PT Treatments $Therapeutic Activity: 8-22 mins PT General Charges $$ ACUTE PT VISIT: 1 Visit         [name], DPT, CLT  Acute Rehabilitation Services Office: [phone] (Secure chat preferred) 178/71  [name] 07/11/2023, 11:08 AM

## 2023-07-11 NOTE — Progress Notes (Addendum)
 PROGRESS NOTE    Terry Vasquez  FMW:[id number] DOB: Nov 06, 1940 DOA: 07/10/2023 PCP: Trinidad Glisson, MD   Brief Narrative: Terry Vasquez is a 83 y.o. male with a history of COPD, diabetes mellitus type 2, left carotid artery stenosis, primary hypertension, CKD.  Patient presented from [facility] secondary to TIA with right-sided symptoms and found to have severe left carotid artery stenosis. Vascular surgery consulted. Neurology consulted.   Assessment and Plan:  Severe left carotid artery stenosis Known history of stenosis in the past, on Plavix. Now with CTA evidence of 70-80% stenosis and complicated by right-sided symptoms TIA symptoms. Vascular surgery consulted for consideration of intervention.  TIA Patient with symptoms of right-sided facial droop and numbness which have resolved. MRI confirms no stroke. CTA significant for high-grade left carotid stenosis measuring 70-80% stenosis. LDL of 77. Hemoglobin A1C of 6.5%. Transthoracic Echocardiogram ordered and pending. Neurology recommendations pending. PT/OT/SLP recommendations pending. -Continue Plavix, start aspirin -Neurology consulted and will see this admission -Permissive hypertension per neurology. Treat blood pressure as needed for SBP > 220, DBP >120  Diabetes mellitus type 2 Well controlled with hemoglobin A1C of 6.5%. Patient is on Januvia and nateglinide as an outpatient which were held. -Continue SSI  Primary hypertension Patient is on Cardura (for BPH) and lisinopril as an outpatient which were held.  Hyperlipidemia -Continue Crestor  COPD Stable. Not on medication management.  CKD stage IIIb Stable.  BPH Patient is on finasteride and Cardura as an outpatient. Cardura held. -Continue finasteride   DVT prophylaxis: Lovenox Code Status:   Code Status: Full Code Family Communication: None at bedside Disposition Plan: Discharge pending ongoing vascular surgery recommendations and  management   Consultants:  Vascular surgery Neurology  Procedures:  None  Antimicrobials: None    Subjective: Patient reports improvement of symptoms. Feels well today. Does reports some productive cough, but that this is his normal.  Objective: BP (!) 147/60 (BP Location: Right Arm)   Pulse 82   Temp 98.7 F (37.1 C) (Oral)   Resp 18   Ht 5' 9 (1.753 m)   Wt 71.7 kg   SpO2 96%   BMI 23.33 kg/m   Examination:  General exam: Appears calm and comfortable Respiratory system: Clear to auscultation. Respiratory effort normal. Cardiovascular system: S1 & S2 heard, RRR. No murmurs, rubs, gallops or clicks. Gastrointestinal system: Abdomen is nondistended, soft and nontender. Normal bowel sounds heard. Central nervous system: Alert and oriented. No focal neurological deficits. Musculoskeletal: No edema. No calf tenderness Psychiatry: Judgement and insight appear normal. Mood & affect appropriate.    Data Reviewed: I have personally reviewed following labs and imaging studies  CBC Lab Results  Component Value Date   WBC 6.2 07/10/2023   RBC 3.74 (L) 07/10/2023   HGB 10.4 (L) 07/10/2023   HCT 32.4 (L) 07/10/2023   MCV 86.6 07/10/2023   MCH 27.8 07/10/2023   PLT 217 07/10/2023   MCHC 32.1 07/10/2023   RDW 15.0 07/10/2023   LYMPHSABS 1.0 12/16/2017   MONOABS 0.8 12/16/2017   EOSABS 0.1 12/16/2017   BASOSABS 0.0 12/16/2017     Last metabolic panel Lab Results  Component Value Date   NA 137 12/16/2017   K 5.0 12/16/2017   CL 108 12/16/2017   CO2 22 12/16/2017   BUN 31 (H) 12/16/2017   CREATININE 1.62 (H) 07/10/2023   GLUCOSE 142 (H) 12/16/2017   GFRNONAA 42 (L) 07/10/2023   GFRAA 47 (L) 12/16/2017   CALCIUM 8.7 (L) 12/16/2017  ANIONGAP 7 12/16/2017    GFR: Estimated Creatinine Clearance: 34.5 mL/min (A) (by C-G formula based on SCr of 1.62 mg/dL (H)).  No results found for this or any previous visit (from the past 240 hours).    Radiology  Studies: No results found.    LOS: 1 day    Elgin Lam, MD [facility] 07/11/2023, 8:40 AM   If 7PM-7AM, please contact night-coverage [email or web address]

## 2023-07-11 NOTE — Progress Notes (Signed)
 Paged MD Briana about hypertension while sitting on EOB, neurology expressed the patient can have permissive hypertension no parameters were placed in orders but PRN hydral was ordered with parameters.  PT BP is elevated when sitting after standing it drops >30 points

## 2023-07-11 NOTE — Progress Notes (Signed)
 This RN instructed to notify Hospitalist on call, if vascular surgery team has not come by for patient evaluation per request. C. Hall,MD notified ,pending response.

## 2023-07-11 NOTE — Hospital Course (Signed)
 Terry Vasquez is a 83 y.o. male with a history of COPD, diabetes mellitus type 2, left carotid artery stenosis, primary hypertension, CKD.  Patient presented from [facility] secondary to TIA with right-sided symptoms and found to have severe left carotid artery stenosis. Vascular surgery consulted. Neurology consulted.

## 2023-07-11 NOTE — Plan of Care (Signed)
 Problem: Education: Goal: Knowledge of General Education information will improve Description: Including pain rating scale, medication(s)/side effects and non-pharmacologic comfort measures Outcome: Progressing   Problem: Health Behavior/Discharge Planning: Goal: Ability to manage health-related needs will improve Outcome: Progressing   Problem: Clinical Measurements: Goal: Ability to maintain clinical measurements within normal limits will improve Outcome: Progressing Goal: Will remain free from infection Outcome: Progressing Goal: Diagnostic test results will improve Outcome: Progressing Goal: Respiratory complications will improve Outcome: Progressing Goal: Cardiovascular complication will be avoided Outcome: Progressing   Problem: Activity: Goal: Risk for activity intolerance will decrease Outcome: Progressing   Problem: Nutrition: Goal: Adequate nutrition will be maintained Outcome: Progressing   Problem: Coping: Goal: Level of anxiety will decrease Outcome: Progressing   Problem: Elimination: Goal: Will not experience complications related to bowel motility Outcome: Progressing Goal: Will not experience complications related to urinary retention Outcome: Progressing   Problem: Pain Managment: Goal: General experience of comfort will improve and/or be controlled Outcome: Progressing   Problem: Safety: Goal: Ability to remain free from injury will improve Outcome: Progressing   Problem: Skin Integrity: Goal: Risk for impaired skin integrity will decrease Outcome: Progressing   Problem: Education: Goal: Knowledge of disease or condition will improve Outcome: Progressing Goal: Knowledge of secondary prevention will improve (MUST DOCUMENT ALL) Outcome: Progressing Goal: Knowledge of patient specific risk factors will improve (DELETE if not current risk factor) Outcome: Progressing   Problem: Ischemic Stroke/TIA Tissue Perfusion: Goal: Complications of  ischemic stroke/TIA will be minimized Outcome: Progressing   Problem: Coping: Goal: Will verbalize positive feelings about self Outcome: Progressing Goal: Will identify appropriate support needs Outcome: Progressing   Problem: Health Behavior/Discharge Planning: Goal: Ability to manage health-related needs will improve Outcome: Progressing Goal: Goals will be collaboratively established with patient/family Outcome: Progressing   Problem: Self-Care: Goal: Ability to participate in self-care as condition permits will improve Outcome: Progressing Goal: Verbalization of feelings and concerns over difficulty with self-care will improve Outcome: Progressing Goal: Ability to communicate needs accurately will improve Outcome: Progressing   Problem: Nutrition: Goal: Risk of aspiration will decrease Outcome: Progressing Goal: Dietary intake will improve Outcome: Progressing   Problem: Education: Goal: Knowledge of the prescribed therapeutic regimen will improve Outcome: Progressing   Problem: Activity: Goal: Ability to tolerate increased activity will improve Outcome: Progressing   Problem: Health Behavior/Discharge Planning: Goal: Identification of resources available to assist in meeting health care needs will improve Outcome: Progressing   Problem: Nutrition: Goal: Maintenance of adequate nutrition will improve Outcome: Progressing   Problem: Clinical Measurements: Goal: Complications related to the disease process, condition or treatment will be avoided or minimized Outcome: Progressing   Problem: Respiratory: Goal: Will regain and/or maintain adequate ventilation Outcome: Progressing   Problem: Skin Integrity: Goal: Demonstration of wound healing without infection will improve Outcome: Progressing   Problem: Education: Goal: Ability to describe self-care measures that may prevent or decrease complications (Diabetes Survival Skills Education) will improve Outcome:  Progressing Goal: Individualized Educational Video(s) Outcome: Progressing   Problem: Coping: Goal: Ability to adjust to condition or change in health will improve Outcome: Progressing   Problem: Fluid Volume: Goal: Ability to maintain a balanced intake and output will improve Outcome: Progressing   Problem: Health Behavior/Discharge Planning: Goal: Ability to identify and utilize available resources and services will improve Outcome: Progressing Goal: Ability to manage health-related needs will improve Outcome: Progressing   Problem: Metabolic: Goal: Ability to maintain appropriate glucose levels will improve Outcome: Progressing   Problem: Nutritional: Goal: Maintenance of  adequate nutrition will improve Outcome: Progressing Goal: Progress toward achieving an optimal weight will improve Outcome: Progressing   Problem: Skin Integrity: Goal: Risk for impaired skin integrity will decrease Outcome: Progressing

## 2023-07-12 ENCOUNTER — Inpatient Hospital Stay (HOSPITAL_COMMUNITY)

## 2023-07-12 ENCOUNTER — Other Ambulatory Visit (HOSPITAL_COMMUNITY)

## 2023-07-12 DIAGNOSIS — N1831 Chronic kidney disease, stage 3a: Secondary | ICD-10-CM

## 2023-07-12 DIAGNOSIS — E785 Hyperlipidemia, unspecified: Secondary | ICD-10-CM | POA: Diagnosis not present

## 2023-07-12 DIAGNOSIS — I10 Essential (primary) hypertension: Secondary | ICD-10-CM | POA: Diagnosis not present

## 2023-07-12 DIAGNOSIS — G459 Transient cerebral ischemic attack, unspecified: Secondary | ICD-10-CM

## 2023-07-12 DIAGNOSIS — I6522 Occlusion and stenosis of left carotid artery: Secondary | ICD-10-CM

## 2023-07-12 DIAGNOSIS — E119 Type 2 diabetes mellitus without complications: Secondary | ICD-10-CM

## 2023-07-12 DIAGNOSIS — I1310 Hypertensive heart and chronic kidney disease without heart failure, with stage 1 through stage 4 chronic kidney disease, or unspecified chronic kidney disease: Secondary | ICD-10-CM | POA: Diagnosis not present

## 2023-07-12 LAB — CBC
HCT: 33 % — ABNORMAL LOW (ref 39.0–52.0)
Hemoglobin: 10.4 g/dL — ABNORMAL LOW (ref 13.0–17.0)
MCH: 27.2 pg (ref 26.0–34.0)
MCHC: 31.5 g/dL (ref 30.0–36.0)
MCV: 86.4 fL (ref 80.0–100.0)
Platelets: 235 10*3/uL (ref 150–400)
RBC: 3.82 MIL/uL — ABNORMAL LOW (ref 4.22–5.81)
RDW: 15 % (ref 11.5–15.5)
WBC: 6.5 10*3/uL (ref 4.0–10.5)
nRBC: 0 % (ref 0.0–0.2)

## 2023-07-12 LAB — BASIC METABOLIC PANEL WITH GFR
Anion gap: 11 (ref 5–15)
BUN: 22 mg/dL (ref 8–23)
CO2: 21 mmol/L — ABNORMAL LOW (ref 22–32)
Calcium: 8.6 mg/dL — ABNORMAL LOW (ref 8.9–10.3)
Chloride: 105 mmol/L (ref 98–111)
Creatinine, Ser: 1.54 mg/dL — ABNORMAL HIGH (ref 0.61–1.24)
GFR, Estimated: 44 mL/min — ABNORMAL LOW (ref >60)
Glucose, Bld: 93 mg/dL (ref 70–99)
Potassium: 4.5 mmol/L (ref 3.5–5.1)
Sodium: 137 mmol/L (ref 135–145)

## 2023-07-12 LAB — ECHOCARDIOGRAM COMPLETE
Area-P 1/2: 5.02 cm2
Calc EF: 66.8 %
Height: 69 in
S' Lateral: 3.5 cm
Single Plane A2C EF: 67.6 %
Single Plane A4C EF: 65 %
Weight: 2528 [oz_av]

## 2023-07-12 LAB — GLUCOSE, CAPILLARY
Glucose-Capillary: 93 mg/dL (ref 70–99)
Glucose-Capillary: 180 mg/dL — ABNORMAL HIGH (ref 70–99)
Glucose-Capillary: 87 mg/dL (ref 70–99)
Glucose-Capillary: 137 mg/dL — ABNORMAL HIGH (ref 70–99)

## 2023-07-12 MED ORDER — DOXAZOSIN MESYLATE 4 MG PO TABS
4.0000 mg | ORAL_TABLET | Freq: Every day | ORAL | Status: DC
  Administered 2023-07-12 – 2023-07-14 (×3): 4 mg via ORAL
  Filled 2023-07-12 (×3): qty 1

## 2023-07-12 MED ORDER — LISINOPRIL 5 MG PO TABS
5.0000 mg | ORAL_TABLET | Freq: Every day | ORAL | Status: DC
  Administered 2023-07-12 – 2023-07-14 (×3): 5 mg via ORAL
  Filled 2023-07-12 (×3): qty 1

## 2023-07-12 NOTE — Progress Notes (Signed)
 Progress Note   Patient: Terry Vasquez FMW:[id number] DOB: 01-09-1941 DOA: 07/10/2023     2 DOS: the patient was seen and examined on 07/12/2023   Brief hospital course: Hollister Wessler is a 83 y.o. male with a history of COPD, diabetes mellitus type 2, left carotid artery stenosis, primary hypertension, CKD.  Patient presented from [facility] secondary to TIA with right-sided symptoms and found to have severe left carotid artery stenosis. Vascular surgery consulted. Neurology consulted who advised vascular eval for carotic stenosis. Vascular surgery plan for left carotid revascularization procedure tomorrow.  Assessment and Plan: Severe left carotid artery stenosis Known history of stenosis in the past, on Plavix. Now with CTA evidence of 70-80% stenosis and complicated by right-sided symptoms TIA symptoms. Carotid ultrasound reviewed, Vascular surgery plan for trans carotid revascularization procedure tomorrow.   TIA Patient with symptoms of right-sided facial droop and numbness which have resolved. MRI confirms no stroke. CTA significant for high-grade left carotid stenosis measuring 70-80% stenosis. LDL of 77. Hemoglobin A1C of 6.5%. Transthoracic Echocardiogram ordered and pending. Neurology recommendations pending.  Continue Plavix, aspirin PT/ OT advised no needs.   Diabetes mellitus type 2 Well controlled with hemoglobin A1C of 6.5%. Patient is on Januvia and nateglinide as an outpatient which were held. Continue SSI   Primary hypertension Resume home dose Cardura (for BPH) and lisinopril.   Hyperlipidemia Continue Crestor   COPD Stable. Not on medication management.   CKD stage IIIb Stable.   BPH Patient is on finasteride and Cardura as an outpatient. Cardura held. Continue finasteride      Out of bed to chair. Incentive spirometry. Nursing supportive care. Fall, aspiration precautions. Diet:  Diet Orders (From admission, onward)     Start      Ordered   07/13/23 0001  Diet NPO time specified  Diet effective midnight        07/12/23 1219   07/12/23 1220  Diet regular Room service appropriate? Yes; Fluid consistency: Thin  Diet effective now       Question Answer Comment  Room service appropriate? Yes   Fluid consistency: Thin      07/12/23 1219           DVT prophylaxis: enoxaparin (LOVENOX) injection 40 mg Start: 07/10/23 1915  Level of care: Telemetry Cardiac   Code Status: Full Code  Subjective: Patient is seen and examined today morning. He is sitting in bed, denies any tingling numbness. No focal weakness, headache.  Physical Exam: Vitals:   07/12/23 0029 07/12/23 0406 07/12/23 0727 07/12/23 1136  BP: (!) 161/57 (!) 145/69 (!) 183/72 135/71  Pulse: 60 76 80 82  Resp: 18 18 18 18   Temp: 98.3 F (36.8 C) 98 F (36.7 C) 97.7 F (36.5 C) 98 F (36.7 C)  TempSrc: Oral Oral Oral Oral  SpO2: 97% 96% 98% 98%  Weight:      Height:        General - Elderly Caucasian male, no apparent distress HEENT - PERRLA, EOMI, atraumatic head, non tender sinuses. Lung - Clear, no rales, rhonchi, wheezes. Heart - S1, S2 heard, no murmurs, rubs, no pedal edema. Abdomen - Soft, non tender, bowel sounds good Neuro - Alert, awake and oriented x 3, non focal exam. Skin - Warm and dry.  Data Reviewed:      Latest Ref Rng & Units 07/12/2023    4:14 AM 07/10/2023    7:06 PM 12/16/2017    4:50 PM  CBC  WBC 4.0 -  10.5 K/uL 6.5  6.2  8.8   Hemoglobin 13.0 - 17.0 g/dL 89.5  89.5  9.2   Hematocrit 39.0 - 52.0 % 33.0  32.4  29.5   Platelets 150 - 400 K/uL 235  217  207       Latest Ref Rng & Units 07/12/2023    4:14 AM 07/10/2023    7:06 PM 12/16/2017    4:50 PM  BMP  Glucose 70 - 99 mg/dL 93   857   BUN 8 - 23 mg/dL 22   31   Creatinine 9.38 - 1.24 mg/dL 8.45  8.37  8.37   Sodium 135 - 145 mmol/L 137   137   Potassium 3.5 - 5.1 mmol/L 4.5   5.0   Chloride 98 - 111 mmol/L 105   108   CO2 22 - 32 mmol/L 21   22   Calcium  8.9 - 10.3 mg/dL 8.6   8.7    ECHOCARDIOGRAM COMPLETE Result Date: 07/12/2023    ECHOCARDIOGRAM REPORT   Patient Name:   NAIF ALABI Date of Exam: 07/12/2023 Medical Rec #:  [id number]          Height:       69.0 in Accession #:    [id number]         Weight:       158.0 lb Date of Birth:  09/29/1940           BSA:          1.869 m Patient Age:    83 years           BP:           135/71 mmHg Patient Gender: M                  HR:           59 bpm. Exam Location:  Inpatient Procedure: 2D Echo, Cardiac Doppler and Color Doppler (Both Spectral and Color            Flow Doppler were utilized during procedure). Indications:    TIA  History:        Patient has prior history of Echocardiogram examinations, most                 recent 09/29/2021. Risk Factors:Hypertension.  Sonographer:    Therisa Crouch Referring Phys: [id number] MOHAMMAD L GARBA IMPRESSIONS  1. Left ventricular ejection fraction, by estimation, is 60 to 65%. The left ventricle has normal function. The left ventricle has no regional wall motion abnormalities. There is mild concentric left ventricular hypertrophy. Left ventricular diastolic parameters are consistent with Grade I diastolic dysfunction (impaired relaxation).  2. Right ventricular systolic function is normal. The right ventricular size is normal.  3. Left atrial size was mildly dilated.  4. The mitral valve is normal in structure. Mild mitral valve regurgitation. No evidence of mitral stenosis.  5. The aortic valve is calcified. There is mild calcification of the aortic valve. Aortic valve regurgitation is not visualized. No aortic stenosis is present.  6. The inferior vena cava is normal in size with greater than 50% respiratory variability, suggesting right atrial pressure of 3 mmHg. FINDINGS  Left Ventricle: Left ventricular ejection fraction, by estimation, is 60 to 65%. The left ventricle has normal function. The left ventricle has no regional wall motion abnormalities. The left ventricular  internal cavity size was normal in size. There is  mild concentric left ventricular hypertrophy. Left ventricular  diastolic parameters are consistent with Grade I diastolic dysfunction (impaired relaxation). Right Ventricle: The right ventricular size is normal. No increase in right ventricular wall thickness. Right ventricular systolic function is normal. Left Atrium: Left atrial size was mildly dilated. Right Atrium: Right atrial size was normal in size. Pericardium: There is no evidence of pericardial effusion. Mitral Valve: The mitral valve is normal in structure. Mild mitral valve regurgitation. No evidence of mitral valve stenosis. Tricuspid Valve: The tricuspid valve is normal in structure. Tricuspid valve regurgitation is not demonstrated. No evidence of tricuspid stenosis. Aortic Valve: The aortic valve is calcified. There is mild calcification of the aortic valve. Aortic valve regurgitation is not visualized. No aortic stenosis is present. Pulmonic Valve: The pulmonic valve was normal in structure. Pulmonic valve regurgitation is not visualized. No evidence of pulmonic stenosis. Aorta: The aortic root is normal in size and structure. Venous: The inferior vena cava is normal in size with greater than 50% respiratory variability, suggesting right atrial pressure of 3 mmHg. IAS/Shunts: No atrial level shunt detected by color flow Doppler.  LEFT VENTRICLE PLAX 2D LVIDd:         5.20 cm      Diastology LVIDs:         3.50 cm      LV e' medial:    7.18 cm/s LV PW:         1.20 cm      LV E/e' medial:  13.6 LV IVS:        1.30 cm      LV e' lateral:   6.53 cm/s LVOT diam:     2.00 cm      LV E/e' lateral: 14.9 LVOT Area:     3.14 cm  LV Volumes (MOD) LV vol d, MOD A2C: 93.2 ml LV vol d, MOD A4C: 120.0 ml LV vol s, MOD A2C: 30.2 ml LV vol s, MOD A4C: 42.0 ml LV SV MOD A2C:     63.0 ml LV SV MOD A4C:     120.0 ml LV SV MOD BP:      72.1 ml RIGHT VENTRICLE             IVC RV Basal diam:  3.10 cm     IVC diam: 1.70  cm RV S prime:     12.60 cm/s TAPSE (M-mode): 2.5 cm LEFT ATRIUM             Index LA diam:        4.10 cm 2.19 cm/m LA Vol (A2C):   68.6 ml 36.70 ml/m LA Vol (A4C):   60.3 ml 32.26 ml/m LA Biplane Vol: 64.5 ml 34.51 ml/m   AORTA Ao Root diam: 3.30 cm Ao Asc diam:  2.90 cm MITRAL VALVE MV Area (PHT): 5.02 cm    SHUNTS MV Decel Time: 151 msec    Systemic Diam: 2.00 cm MV E velocity: 97.50 cm/s MV A velocity: 86.50 cm/s MV E/A ratio:  1.13 Aditya Sabharwal Electronically signed by Ria Commander Signature Date/Time: 07/12/2023/1:38:18 PM    Final    VAS US  CAROTID Result Date: 07/12/2023 Carotid Arterial Duplex Study Patient Name:  DIONIS AUTRY  Date of Exam:   07/12/2023 Medical Rec #: [id number]           Accession #:    [id number] Date of Birth: Jul 17, 1940            Patient Gender: M Patient Age:   54 years Exam Location:  [place]  [facility] Procedure:      VAS US  CAROTID Referring Phys: [facility] --------------------------------------------------------------------------------  Indications:       TIA, Carotid artery disease and Facial droop. Patient                    transferred from [facility] after CTA indicated                    high-grade left carotid stenosis. Risk Factors:      Hypertension, hyperlipidemia, Diabetes. Other Factors:     CKD IIIb, COPD, on Plavix. Comparison Study:  Prior carotid duplex done 03/01/2018 indicating 1-39% right                    ICA stenosis and 40-59% left ICA stenosis. Patient reports                    carotid done at [facility] 07/09/23, but there is no                    access to records. Performing Technologist: Alberta Lis RVS  Examination Guidelines: A complete evaluation includes B-mode imaging, spectral Doppler, color Doppler, and power Doppler as needed of all accessible portions of each vessel. Bilateral testing is considered an integral part of a complete examination. Limited examinations for reoccurring indications may be performed as  noted.  Right Carotid Findings: +----------+--------+--------+--------+---------------------+------------------+           PSV cm/sEDV cm/sStenosisPlaque Description   Comments           +----------+--------+--------+--------+---------------------+------------------+ CCA Prox  125     14                                   intimal thickening +----------+--------+--------+--------+---------------------+------------------+ CCA Distal65      12              heterogenous                            +----------+--------+--------+--------+---------------------+------------------+ ICA Prox  128     29      1-39%   calcific, bulky, and Shadowing                                            irregular                               +----------+--------+--------+--------+---------------------+------------------+ ICA Mid   110     21                                                      +----------+--------+--------+--------+---------------------+------------------+ ICA Distal95      21                                                      +----------+--------+--------+--------+---------------------+------------------+ ECA  173     3       >50%    calcific                                +----------+--------+--------+--------+---------------------+------------------+ +----------+--------+-------+---------+-------------------+           PSV cm/sEDV cmsDescribe Arm Pressure (mmHG) +----------+--------+-------+---------+-------------------+ [id number]             Turbulent                    +----------+--------+-------+---------+-------------------+ +---------+--------+--+--------+--+ VertebralPSV cm/s78EDV cm/s12 +---------+--------+--+--------+--+  Left Carotid Findings: +----------+--------+--------+--------+---------------------+------------------+           PSV cm/sEDV cm/sStenosisPlaque Description   Comments            +----------+--------+--------+--------+---------------------+------------------+ CCA Prox  81      16                                   intimal thickening +----------+--------+--------+--------+---------------------+------------------+ CCA Distal64      15              heterogenous                            +----------+--------+--------+--------+---------------------+------------------+ ICA Prox  219     40      40-59%  calcific, irregular  Shadowing,                                           and diffuse          tortuous           +----------+--------+--------+--------+---------------------+------------------+ ICA Mid   144     23                                                      +----------+--------+--------+--------+---------------------+------------------+ ICA Distal163     32                                                      +----------+--------+--------+--------+---------------------+------------------+ ECA       579     9       >50%    calcific                                +----------+--------+--------+--------+---------------------+------------------+ +----------+--------+--------+--------+-------------------+           PSV cm/sEDV cm/sDescribeArm Pressure (mmHG) +----------+--------+--------+--------+-------------------+ [id number]                                         +----------+--------+--------+--------+-------------------+ +---------+--------+--+--------+--+ VertebralPSV cm/s77EDV cm/s20 +---------+--------+--+--------+--+   Summary: Right Carotid: Velocities in the right ICA are consistent with a 1-39% stenosis.                The ECA  appears <50% stenosed. Left Carotid: Velocities in the left ICA are consistent with a 40-59% stenosis               by velocity. However, there is 60-79% stenosis by ICA/CCA ration.               The ECA appears <50% stenosed. Vertebrals:  Bilateral vertebral arteries demonstrate antegrade  flow. Subclavians: Normal flow hemodynamics were seen in bilateral subclavian              arteries. *See table(s) above for measurements and observations.     Preliminary     Family Communication: Discussed with patient, he understand and agree. All questions answered.  Disposition: Status is: Inpatient Remains inpatient appropriate because: vascular surgery  Planned Discharge Destination: Home with Home Health     Time spent: 38 minutes  Author: Concepcion Riser, MD 07/12/2023 2:15 PM Secure chat 7am to 7pm For on call review [email or web address].

## 2023-07-12 NOTE — Progress Notes (Signed)
   07/12/23 1330  TOC Brief Assessment  Insurance and Status Reviewed  Patient has primary care physician Yes  Home environment has been reviewed home w/ spouse  Prior level of function: self/independent  Prior/Current Home Services No current home services  Social Drivers of Health Review SDOH reviewed no interventions necessary  Readmission risk has been reviewed Yes  Transition of care needs no transition of care needs at this time    We will continue to monitor patient advancement through interdisciplinary progression rounds. If new patient transition needs arise, please place a TOC consult.

## 2023-07-12 NOTE — Progress Notes (Addendum)
 SLP Cancellation Note  Patient Details Name: Terry Vasquez MRN: [id number] DOB: 11-Sep-1940   Cancelled treatment:       Reason Eval/Treat Not Completed: Patient at procedure or test/unavailable. Attempted SLE x2, however, pt off unit in AM. Having in-room procedure in PM. Will continue efforts.  Terry Vasquez, MSP, CCC-SLP Speech Language Pathologist Office: [phone]  Terry Vasquez 07/12/2023, 9:04 AM 07/12/2023.13:10 PM

## 2023-07-12 NOTE — Progress Notes (Signed)
 VASCULAR LAB    Carotid duplex has been performed.  See CV proc for preliminary results.   Jonnae Fonseca, RVT 07/12/2023, 9:40 AM
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
## 2023-07-12 NOTE — Progress Notes (Addendum)
 Progress Note    07/12/2023 7:31 AM * No surgery found *  Subjective:  denies any recurrent or worsening facial tingling or numbness    Vitals:   07/12/23 0406 07/12/23 0727  BP: (!) 145/69 (!) 183/72  Pulse: 76 80  Resp: 18 18  Temp: 98 F (36.7 C) 97.7 F (36.5 C)  SpO2: 96% 98%    Physical Exam: General:  sitting up in bed, NAD Lungs:  nonlabored Extremities:  moving all extremities equally without deficit Neuro: no slurred speech or tongue deviation. Baseline minimal right sided facial droop  CBC    Component Value Date/Time   WBC 6.5 07/12/2023 0414   RBC 3.82 (L) 07/12/2023 0414   HGB 10.4 (L) 07/12/2023 0414   HCT 33.0 (L) 07/12/2023 0414   PLT 235 07/12/2023 0414   MCV 86.4 07/12/2023 0414   MCH 27.2 07/12/2023 0414   MCHC 31.5 07/12/2023 0414   RDW 15.0 07/12/2023 0414   LYMPHSABS 1.0 12/16/2017 1650   MONOABS 0.8 12/16/2017 1650   EOSABS 0.1 12/16/2017 1650   BASOSABS 0.0 12/16/2017 1650    BMET    Component Value Date/Time   NA 137 07/12/2023 0414   K 4.5 07/12/2023 0414   CL 105 07/12/2023 0414   CO2 21 (L) 07/12/2023 0414   GLUCOSE 93 07/12/2023 0414   BUN 22 07/12/2023 0414   CREATININE 1.54 (H) 07/12/2023 0414   CALCIUM 8.6 (L) 07/12/2023 0414   GFRNONAA 44 (L) 07/12/2023 0414   GFRAA 47 (L) 12/16/2017 1650    INR No results found for: INR   Intake/Output Summary (Last 24 hours) at 07/12/2023 0731 Last data filed at 07/11/2023 1944 Gross per 24 hour  Intake 1595.22 ml  Output 0 ml  Net 1595.22 ml      Assessment/Plan:  83 y.o. male admitted for TIA   -He is doing well this morning without any further stroke like symptoms -Neurology has evaluated the patient and believes his symptoms are consistent with left hemispheric TIA, possibly due to carotid stenosis -Pending carotid duplex today for further investigation of carotid stenosis. May need left carotid revascularization depending on degree of stenosis by  duplex  Ahmed Holster, PA-C [facility] [phone] 07/12/2023 7:31 AM  VASCULAR STAFF ADDENDUM: I have independently interviewed and examined the patient. I agree with the above.    I explained the 3 modalities of treatment with best medical therapy, carotid endarterectomy and carotid stenting. We discussed the risks and benefits of carotid intrevention.  I informed that there is a small risk of stroke during the procedure which is about 1 to 2% but with medical therapy alone there is a 25% risk of ipsilateral stroke over the next 2 years.  I explained that with intervention, that risk is significantly reduced.  We also discussed the risk of cranial nerve injury, bleeding, infection and the small risk of restenosis in the future.   I have completed Share Decision Making with Nancyann Cheese Mccarver prior to surgery.  Conversations included: -Discussion of all treatment options including carotid endarterectomy (CEA), CAS (which includes transcarotid artery revascularization (TCAR)), and optimal medical therapy (OMT)). -Explanation of risks and benefits for each option specific to Nancyann Cheese Douthat's clinical situation. -Integration of clinical guidelines as it relates to the patient's history and co-morbidities -Discussion and incorporation of Nancyann Cheese Rick and their personal preferences and priorities in choosing a treatment plan.  Plan for L TCAR tomorrow. NPO midnight Consent ordered   Norman RAMAN  Pearline MD [facility] Phone Number: [phone] 07/12/2023 12:17 PM

## 2023-07-12 NOTE — Plan of Care (Signed)
 Problem: Education: Goal: Knowledge of General Education information will improve Description: Including pain rating scale, medication(s)/side effects and non-pharmacologic comfort measures Outcome: Progressing   Problem: Health Behavior/Discharge Planning: Goal: Ability to manage health-related needs will improve Outcome: Progressing   Problem: Clinical Measurements: Goal: Ability to maintain clinical measurements within normal limits will improve Outcome: Progressing Goal: Will remain free from infection Outcome: Progressing Goal: Diagnostic test results will improve Outcome: Progressing Goal: Respiratory complications will improve Outcome: Progressing Goal: Cardiovascular complication will be avoided Outcome: Progressing   Problem: Activity: Goal: Risk for activity intolerance will decrease Outcome: Progressing   Problem: Nutrition: Goal: Adequate nutrition will be maintained Outcome: Progressing   Problem: Coping: Goal: Level of anxiety will decrease Outcome: Progressing   Problem: Elimination: Goal: Will not experience complications related to bowel motility Outcome: Progressing Goal: Will not experience complications related to urinary retention Outcome: Progressing   Problem: Pain Managment: Goal: General experience of comfort will improve and/or be controlled Outcome: Progressing   Problem: Safety: Goal: Ability to remain free from injury will improve Outcome: Progressing   Problem: Skin Integrity: Goal: Risk for impaired skin integrity will decrease Outcome: Progressing   Problem: Education: Goal: Knowledge of disease or condition will improve Outcome: Progressing Goal: Knowledge of secondary prevention will improve (MUST DOCUMENT ALL) Outcome: Progressing Goal: Knowledge of patient specific risk factors will improve (DELETE if not current risk factor) Outcome: Progressing   Problem: Ischemic Stroke/TIA Tissue Perfusion: Goal: Complications of  ischemic stroke/TIA will be minimized Outcome: Progressing   Problem: Coping: Goal: Will verbalize positive feelings about self Outcome: Progressing Goal: Will identify appropriate support needs Outcome: Progressing   Problem: Health Behavior/Discharge Planning: Goal: Ability to manage health-related needs will improve Outcome: Progressing Goal: Goals will be collaboratively established with patient/family Outcome: Progressing   Problem: Self-Care: Goal: Ability to participate in self-care as condition permits will improve Outcome: Progressing Goal: Verbalization of feelings and concerns over difficulty with self-care will improve Outcome: Progressing Goal: Ability to communicate needs accurately will improve Outcome: Progressing   Problem: Nutrition: Goal: Risk of aspiration will decrease Outcome: Progressing Goal: Dietary intake will improve Outcome: Progressing   Problem: Education: Goal: Knowledge of the prescribed therapeutic regimen will improve Outcome: Progressing   Problem: Activity: Goal: Ability to tolerate increased activity will improve Outcome: Progressing   Problem: Health Behavior/Discharge Planning: Goal: Identification of resources available to assist in meeting health care needs will improve Outcome: Progressing   Problem: Nutrition: Goal: Maintenance of adequate nutrition will improve Outcome: Progressing   Problem: Clinical Measurements: Goal: Complications related to the disease process, condition or treatment will be avoided or minimized Outcome: Progressing   Problem: Respiratory: Goal: Will regain and/or maintain adequate ventilation Outcome: Progressing   Problem: Skin Integrity: Goal: Demonstration of wound healing without infection will improve Outcome: Progressing   Problem: Education: Goal: Ability to describe self-care measures that may prevent or decrease complications (Diabetes Survival Skills Education) will improve Outcome:  Progressing Goal: Individualized Educational Video(s) Outcome: Progressing   Problem: Coping: Goal: Ability to adjust to condition or change in health will improve Outcome: Progressing   Problem: Fluid Volume: Goal: Ability to maintain a balanced intake and output will improve Outcome: Progressing   Problem: Health Behavior/Discharge Planning: Goal: Ability to identify and utilize available resources and services will improve Outcome: Progressing Goal: Ability to manage health-related needs will improve Outcome: Progressing   Problem: Metabolic: Goal: Ability to maintain appropriate glucose levels will improve Outcome: Progressing   Problem: Nutritional: Goal: Maintenance of  adequate nutrition will improve Outcome: Progressing Goal: Progress toward achieving an optimal weight will improve Outcome: Progressing   Problem: Skin Integrity: Goal: Risk for impaired skin integrity will decrease Outcome: Progressing

## 2023-07-12 NOTE — Plan of Care (Signed)

## 2023-07-13 ENCOUNTER — Other Ambulatory Visit: Payer: Self-pay

## 2023-07-13 ENCOUNTER — Inpatient Hospital Stay (HOSPITAL_COMMUNITY)

## 2023-07-13 ENCOUNTER — Inpatient Hospital Stay (HOSPITAL_COMMUNITY): Admitting: Certified Registered Nurse Anesthetist

## 2023-07-13 ENCOUNTER — Encounter (HOSPITAL_COMMUNITY)
Admission: EM | Disposition: A | Discharge status: Home / Self Care | Payer: Self-pay | Source: Other Acute Inpatient Hospital | Attending: Family Medicine

## 2023-07-13 ENCOUNTER — Encounter (HOSPITAL_COMMUNITY): Payer: Self-pay | Admitting: Internal Medicine

## 2023-07-13 DIAGNOSIS — N1831 Chronic kidney disease, stage 3a: Secondary | ICD-10-CM | POA: Diagnosis not present

## 2023-07-13 DIAGNOSIS — N183 Chronic kidney disease, stage 3 unspecified: Secondary | ICD-10-CM | POA: Diagnosis not present

## 2023-07-13 DIAGNOSIS — I6522 Occlusion and stenosis of left carotid artery: Secondary | ICD-10-CM

## 2023-07-13 DIAGNOSIS — E538 Deficiency of other specified B group vitamins: Secondary | ICD-10-CM | POA: Diagnosis not present

## 2023-07-13 DIAGNOSIS — I10 Essential (primary) hypertension: Secondary | ICD-10-CM | POA: Diagnosis not present

## 2023-07-13 DIAGNOSIS — J449 Chronic obstructive pulmonary disease, unspecified: Secondary | ICD-10-CM

## 2023-07-13 DIAGNOSIS — I129 Hypertensive chronic kidney disease with stage 1 through stage 4 chronic kidney disease, or unspecified chronic kidney disease: Secondary | ICD-10-CM | POA: Diagnosis not present

## 2023-07-13 DIAGNOSIS — E119 Type 2 diabetes mellitus without complications: Secondary | ICD-10-CM | POA: Diagnosis not present

## 2023-07-13 DIAGNOSIS — I1310 Hypertensive heart and chronic kidney disease without heart failure, with stage 1 through stage 4 chronic kidney disease, or unspecified chronic kidney disease: Secondary | ICD-10-CM | POA: Diagnosis not present

## 2023-07-13 DIAGNOSIS — E1159 Type 2 diabetes mellitus with other circulatory complications: Secondary | ICD-10-CM | POA: Diagnosis not present

## 2023-07-13 HISTORY — PX: TRANSCAROTID ARTERY REVASCULARIZATIONÂ: SHX6778

## 2023-07-13 LAB — SURGICAL PCR SCREEN
MRSA, PCR: NEGATIVE
Staphylococcus aureus: NEGATIVE

## 2023-07-13 LAB — ABO/RH: ABO/RH(D): A POS

## 2023-07-13 LAB — GLUCOSE, CAPILLARY
Glucose-Capillary: 108 mg/dL — ABNORMAL HIGH (ref 70–99)
Glucose-Capillary: 106 mg/dL — ABNORMAL HIGH (ref 70–99)
Glucose-Capillary: 89 mg/dL (ref 70–99)
Glucose-Capillary: 93 mg/dL (ref 70–99)
Glucose-Capillary: 243 mg/dL — ABNORMAL HIGH (ref 70–99)

## 2023-07-13 LAB — TYPE AND SCREEN
ABO/RH(D): A POS
Antibody Screen: NEGATIVE

## 2023-07-13 SURGERY — TRANSCAROTID ARTERY REVASCULARIZATION (TCAR)
Anesthesia: General | Site: Neck | Laterality: Left

## 2023-07-13 MED ORDER — CHLORHEXIDINE GLUCONATE 0.12 % MT SOLN
OROMUCOSAL | Status: AC
  Administered 2023-07-13: 15 mL via OROMUCOSAL
  Filled 2023-07-13: qty 15

## 2023-07-13 MED ORDER — IODIXANOL 320 MG/ML IV SOLN
INTRAVENOUS | Status: DC | PRN
  Administered 2023-07-13: 12 mL

## 2023-07-13 MED ORDER — POLYETHYLENE GLYCOL 3350 17 G PO PACK: 17.0000 g | PACK | Freq: Every day | ORAL | Status: DC | PRN

## 2023-07-13 MED ORDER — SODIUM CHLORIDE 0.9 % IV SOLN: INTRAVENOUS | Status: AC

## 2023-07-13 MED ORDER — ORAL CARE MOUTH RINSE: 15.0000 mL | Freq: Once | OROMUCOSAL | Status: AC

## 2023-07-13 MED ORDER — ROCURONIUM BROMIDE 10 MG/ML (PF) SYRINGE
PREFILLED_SYRINGE | INTRAVENOUS | Status: DC | PRN
  Administered 2023-07-13: 60 mg via INTRAVENOUS

## 2023-07-13 MED ORDER — HEPARIN SODIUM (PORCINE) 1000 UNIT/ML IJ SOLN
INTRAMUSCULAR | Status: DC | PRN
  Administered 2023-07-13: 7000 [IU] via INTRAVENOUS

## 2023-07-13 MED ORDER — HYDRALAZINE HCL 20 MG/ML IJ SOLN
10.0000 mg | Freq: Once | INTRAMUSCULAR | Status: AC
  Administered 2023-07-13: 10 mg via INTRAVENOUS

## 2023-07-13 MED ORDER — SODIUM CHLORIDE 0.9 % IV SOLN: 500.0000 mL | Freq: Once | INTRAVENOUS | Status: DC | PRN

## 2023-07-13 MED ORDER — PHENYLEPHRINE HCL-NACL 20-0.9 MG/250ML-% IV SOLN
INTRAVENOUS | Status: DC | PRN
  Administered 2023-07-13: 40 ug/min via INTRAVENOUS

## 2023-07-13 MED ORDER — LABETALOL HCL 5 MG/ML IV SOLN
INTRAVENOUS | Status: AC
Start: 2023-07-13 — End: 2023-07-13
  Filled 2023-07-13: qty 4

## 2023-07-13 MED ORDER — CHLORHEXIDINE GLUCONATE 0.12 % MT SOLN
15.0000 mL | Freq: Once | OROMUCOSAL | Status: AC
Start: 2023-07-13 — End: 2023-07-13

## 2023-07-13 MED ORDER — LABETALOL HCL 5 MG/ML IV SOLN
10.0000 mg | Freq: Once | INTRAVENOUS | Status: AC
  Administered 2023-07-13: 10 mg via INTRAVENOUS

## 2023-07-13 MED ORDER — LIDOCAINE HCL (CARDIAC) PF 100 MG/5ML IV SOSY
PREFILLED_SYRINGE | INTRAVENOUS | Status: DC | PRN
  Administered 2023-07-13: 100 mg via INTRAVENOUS

## 2023-07-13 MED ORDER — ACETAMINOPHEN 10 MG/ML IV SOLN: 1000.0000 mg | Freq: Once | INTRAVENOUS | Status: DC | PRN

## 2023-07-13 MED ORDER — PROPOFOL 10 MG/ML IV BOLUS
INTRAVENOUS | Status: DC | PRN
  Administered 2023-07-13: 100 mg via INTRAVENOUS

## 2023-07-13 MED ORDER — CEFAZOLIN SODIUM 1 G IJ SOLR
INTRAMUSCULAR | Status: AC
  Filled 2023-07-13: qty 20

## 2023-07-13 MED ORDER — HEMOSTATIC AGENTS (NO CHARGE) OPTIME
TOPICAL | Status: DC | PRN
  Administered 2023-07-13: 1 via TOPICAL

## 2023-07-13 MED ORDER — HEPARIN 6000 UNIT IRRIGATION SOLUTION
Status: AC
  Filled 2023-07-13: qty 500

## 2023-07-13 MED ORDER — CEFAZOLIN SODIUM-DEXTROSE 2-3 GM-%(50ML) IV SOLR
INTRAVENOUS | Status: DC | PRN
  Administered 2023-07-13: 2 g via INTRAVENOUS

## 2023-07-13 MED ORDER — CEFAZOLIN SODIUM-DEXTROSE 2-4 GM/100ML-% IV SOLN
2.0000 g | Freq: Three times a day (TID) | INTRAVENOUS | Status: AC
  Administered 2023-07-13 – 2023-07-14 (×2): 2 g via INTRAVENOUS
  Filled 2023-07-13 (×2): qty 100

## 2023-07-13 MED ORDER — LACTATED RINGERS IV SOLN: INTRAVENOUS | Status: DC

## 2023-07-13 MED ORDER — PROTAMINE SULFATE 10 MG/ML IV SOLN
INTRAVENOUS | Status: AC
  Filled 2023-07-13: qty 5

## 2023-07-13 MED ORDER — PROTAMINE SULFATE 10 MG/ML IV SOLN
INTRAVENOUS | Status: DC | PRN
  Administered 2023-07-13: 40 mg via INTRAVENOUS

## 2023-07-13 MED ORDER — OXYCODONE-ACETAMINOPHEN 5-325 MG PO TABS: 1.0000 | ORAL_TABLET | ORAL | Status: DC | PRN

## 2023-07-13 MED ORDER — DEXAMETHASONE SODIUM PHOSPHATE 10 MG/ML IJ SOLN
INTRAMUSCULAR | Status: AC
  Filled 2023-07-13: qty 1

## 2023-07-13 MED ORDER — ONDANSETRON HCL 4 MG/2ML IJ SOLN
INTRAMUSCULAR | Status: DC | PRN
  Administered 2023-07-13: 4 mg via INTRAVENOUS

## 2023-07-13 MED ORDER — SUGAMMADEX SODIUM 200 MG/2ML IV SOLN
INTRAVENOUS | Status: DC | PRN
  Administered 2023-07-13: 200 mg via INTRAVENOUS

## 2023-07-13 MED ORDER — FENTANYL CITRATE (PF) 250 MCG/5ML IJ SOLN
INTRAMUSCULAR | Status: DC | PRN
  Administered 2023-07-13: 100 ug via INTRAVENOUS

## 2023-07-13 MED ORDER — GLYCOPYRROLATE PF 0.2 MG/ML IJ SOSY
PREFILLED_SYRINGE | INTRAMUSCULAR | Status: AC
  Filled 2023-07-13: qty 1

## 2023-07-13 MED ORDER — GLYCOPYRROLATE 0.2 MG/ML IJ SOLN
INTRAMUSCULAR | Status: DC | PRN
  Administered 2023-07-13: .2 mg via INTRAVENOUS

## 2023-07-13 MED ORDER — OXYCODONE HCL 5 MG PO TABS: 5.0000 mg | ORAL_TABLET | Freq: Once | ORAL | Status: DC | PRN

## 2023-07-13 MED ORDER — ONDANSETRON HCL 4 MG/2ML IJ SOLN: 4.0000 mg | Freq: Four times a day (QID) | INTRAMUSCULAR | Status: DC | PRN

## 2023-07-13 MED ORDER — FENTANYL CITRATE (PF) 100 MCG/2ML IJ SOLN: 25.0000 ug | INTRAMUSCULAR | Status: DC | PRN

## 2023-07-13 MED ORDER — HYDRALAZINE HCL 20 MG/ML IJ SOLN
INTRAMUSCULAR | Status: AC
  Filled 2023-07-13: qty 1

## 2023-07-13 MED ORDER — DEXAMETHASONE SODIUM PHOSPHATE 10 MG/ML IJ SOLN
INTRAMUSCULAR | Status: DC | PRN
  Administered 2023-07-13: 10 mg via INTRAVENOUS

## 2023-07-13 MED ORDER — PHENOL 1.4 % MT LIQD: 1.0000 | OROMUCOSAL | Status: DC | PRN

## 2023-07-13 MED ORDER — EPHEDRINE SULFATE-NACL 50-0.9 MG/10ML-% IV SOSY
PREFILLED_SYRINGE | INTRAVENOUS | Status: DC | PRN
Start: 2023-07-13 — End: 2023-07-13
  Administered 2023-07-13 (×2): 2.5 mg via INTRAVENOUS

## 2023-07-13 MED ORDER — DROPERIDOL 2.5 MG/ML IJ SOLN: 0.6250 mg | Freq: Once | INTRAMUSCULAR | Status: DC | PRN

## 2023-07-13 MED ORDER — DOCUSATE SODIUM 100 MG PO CAPS
100.0000 mg | ORAL_CAPSULE | Freq: Every day | ORAL | Status: DC
  Administered 2023-07-14: 100 mg via ORAL
  Filled 2023-07-13: qty 1

## 2023-07-13 MED ORDER — 0.9 % SODIUM CHLORIDE (POUR BTL) OPTIME
TOPICAL | Status: DC | PRN
  Administered 2023-07-13: 1000 mL

## 2023-07-13 MED ORDER — MORPHINE SULFATE (PF) 2 MG/ML IV SOLN: 2.0000 mg | INTRAVENOUS | Status: DC | PRN

## 2023-07-13 MED ORDER — SODIUM CHLORIDE 0.9 % IV SOLN
0.1500 ug/kg/min | INTRAVENOUS | Status: DC
  Administered 2023-07-13: .15 ug/kg/min via INTRAVENOUS
  Filled 2023-07-13: qty 2000

## 2023-07-13 MED ORDER — PHENYLEPHRINE 80 MCG/ML (10ML) SYRINGE FOR IV PUSH (FOR BLOOD PRESSURE SUPPORT)
PREFILLED_SYRINGE | INTRAVENOUS | Status: DC | PRN
  Administered 2023-07-13: 40 ug via INTRAVENOUS
  Administered 2023-07-13: 80 ug via INTRAVENOUS
  Administered 2023-07-13: 40 ug via INTRAVENOUS

## 2023-07-13 MED ORDER — LACTATED RINGERS IV SOLN: INTRAVENOUS | Status: DC | PRN

## 2023-07-13 MED ORDER — HEPARIN 6000 UNIT IRRIGATION SOLUTION
Status: DC | PRN
  Administered 2023-07-13: 1

## 2023-07-13 MED ORDER — LABETALOL HCL 5 MG/ML IV SOLN: 10.0000 mg | INTRAVENOUS | Status: DC | PRN

## 2023-07-13 MED ORDER — METOPROLOL TARTRATE 5 MG/5ML IV SOLN: 2.5000 mg | INTRAVENOUS | Status: DC | PRN

## 2023-07-13 MED ORDER — POTASSIUM CHLORIDE CRYS ER 20 MEQ PO TBCR: 40.0000 meq | EXTENDED_RELEASE_TABLET | Freq: Every day | ORAL | Status: DC | PRN

## 2023-07-13 MED ORDER — OXYCODONE HCL 5 MG/5ML PO SOLN: 5.0000 mg | Freq: Once | ORAL | Status: DC | PRN

## 2023-07-13 MED ORDER — FENTANYL CITRATE (PF) 250 MCG/5ML IJ SOLN
INTRAMUSCULAR | Status: AC
  Filled 2023-07-13: qty 5

## 2023-07-13 MED ORDER — ONDANSETRON HCL 4 MG/2ML IJ SOLN
INTRAMUSCULAR | Status: AC
  Filled 2023-07-13: qty 2

## 2023-07-13 SURGICAL SUPPLY — 40 items
BAG BANDED W/RUBBER/TAPE 36X54 (MISCELLANEOUS) ×1 IMPLANT
BAG COUNTER SPONGE SURGICOUNT (BAG) ×1 IMPLANT
CANISTER SUCTION 3000ML PPV (SUCTIONS) ×1 IMPLANT
CATH BALLN ENROUTE 6X35 (CATHETERS) IMPLANT
CLIP TI MEDIUM 6 (CLIP) ×1 IMPLANT
CLIP TI WIDE RED SMALL 6 (CLIP) ×1 IMPLANT
COVER DOME SNAP 22 D (MISCELLANEOUS) ×1 IMPLANT
COVER PROBE W GEL 5X96 (DRAPES) ×1 IMPLANT
DERMABOND ADVANCED .7 DNX12 (GAUZE/BANDAGES/DRESSINGS) ×1 IMPLANT
DRAPE FEMORAL ANGIO 80X135IN (DRAPES) ×1 IMPLANT
ELECTRODE REM PT RTRN 9FT ADLT (ELECTROSURGICAL) ×1 IMPLANT
GLOVE BIOGEL PI IND STRL 7.0 (GLOVE) ×1 IMPLANT
GOWN STRL REUS W/ TWL LRG LVL3 (GOWN DISPOSABLE) ×2 IMPLANT
GOWN STRL REUS W/ TWL XL LVL3 (GOWN DISPOSABLE) ×1 IMPLANT
GUIDEWIRE ENROUTE 0.014 (WIRE) ×1 IMPLANT
HEMOSTAT SNOW SURGICEL 2X4 (HEMOSTASIS) IMPLANT
KIT BASIN OR (CUSTOM PROCEDURE TRAY) ×1 IMPLANT
KIT ENCORE 26 ADVANTAGE (KITS) ×1 IMPLANT
KIT INTRODUCER GALT 7 (INTRODUCER) ×1 IMPLANT
KIT TURNOVER KIT B (KITS) ×1 IMPLANT
NDL HYPO 25GX1X1/2 BEV (NEEDLE) IMPLANT
NEEDLE HYPO 25GX1X1/2 BEV (NEEDLE) IMPLANT
PACK CAROTID (CUSTOM PROCEDURE TRAY) ×1 IMPLANT
POSITIONER HEAD DONUT 9IN (MISCELLANEOUS) ×1 IMPLANT
SET MICROPUNCTURE 5F STIFF (MISCELLANEOUS) ×1 IMPLANT
STENT TRANSCAROTID SYS 10X40 (Permanent Stent) IMPLANT
SUT MNCRL AB 4-0 PS2 18 (SUTURE) ×1 IMPLANT
SUT PROLENE 5 0 C 1 24 (SUTURE) IMPLANT
SUT PROLENE 6 0 BV (SUTURE) ×1 IMPLANT
SUT SILK 2 0 PERMA HAND 18 BK (SUTURE) ×1 IMPLANT
SUT SILK 3-0 18XBRD TIE 12 (SUTURE) IMPLANT
SUT VIC AB 2-0 CT1 TAPERPNT 27 (SUTURE) ×1 IMPLANT
SUT VIC AB 3-0 SH 27X BRD (SUTURE) ×1 IMPLANT
SYR 10ML LL (SYRINGE) ×3 IMPLANT
SYR 20ML LL LF (SYRINGE) ×1 IMPLANT
SYR CONTROL 10ML LL (SYRINGE) IMPLANT
SYSTEM TRANSCAROTID NEUROPRTCT (MISCELLANEOUS) ×1 IMPLANT
TOWEL GREEN STERILE (TOWEL DISPOSABLE) ×1 IMPLANT
WATER STERILE IRR 1000ML POUR (IV SOLUTION) ×1 IMPLANT
WIRE BENTSON .035X145CM (WIRE) ×1 IMPLANT

## 2023-07-13 NOTE — Plan of Care (Signed)

## 2023-07-13 NOTE — Op Note (Signed)
 OPERATIVE NOTE  PROCEDURE:   Left carotid stent placement (TCAR) Flow reversal embolic neuro-protection Ultrasound-guided left femoral vein access  PRE-OPERATIVE DIAGNOSIS: symptomatic left carotid artery stenosis  POST-OPERATIVE DIAGNOSIS: same as above   SURGEON: Norman GORMAN Serve MD  ASSISTANT(S): Adina Sender, PA  Given the complexity of the case,  the assistant was necessary in order to expedient the procedure and safely perform the technical aspects of the operation.  The assistant provided traction and countertraction while inserting the TCAR sheath. The assistant also played a critical role in pinning wires while advancing balloons and the stent across the critical stenosis. These skills could not have been adequately performed by a scrub tech assistant.    ANESTHESIA: general  ESTIMATED BLOOD LOSS: 10 cc  FINDING(S): Severe proximal left ICA stenosis, approximately 70% Widely patent stent with brisk flow on completion angiogram, approximate 30% residual stenosis.  Predilation balloon: 6 mm x 35 mm Stent: 10 mm x 40 mm Fluoroscopy time: 2.5 minutes Contrast volume: 12 cc Flow reversal: 7 minutes  SPECIMEN(S): None  INDICATIONS:   Terry Vasquez is a 83 y.o. male who presents with left symptomatic carotid stenosis 70%.  I discussed with the patient the risks, benefits, medical management, carotid endarterectomy and TCAR.  The patient is a candidate for TCAR. I discussed the procedural details of both carotid endarterectomy and TCAR with the patient and recommended TCAR based on his anatomy and medical comorbidity's.  The patient is aware that the risks of TCAR include but are not limited to: bleeding, infection, stroke, myocardial infarction, death, cranial nerve injuries both temporary and permanent, neck hematoma, possible airway compromise, labile blood pressure post-operatively, cerebral hyperperfusion syndrome, and possible need for additional interventions in  the future. The patient is aware of the risks and agrees to proceed forward with the procedure.  DESCRIPTION: The patient was transferred to the operating room and positioned supine on the operating room table. Anesthesia was induced. The neck and groins were prepped and draped in standard fashion. A surgical timeout was performed confirming correct patient, procedure, and operative location.  The right common femoral vein (CFV) was accessed under ultrasound guidance, using standard Seldinger and micropuncture access technique. Permanent recorded image(s) was/were saved in the patient's medical record. The Venous Return Sheath was advanced into the CFV over the 0.035" wire provided. Blood was aspirated from the flow line followed by flushing of the Venous Sheath with heparinized saline. The Venous Sheath was secured to the patient's skin with suture to maintain optimal position in the vessel.   Using intraoperative ultrasound the course of the common carotid artery was mapped on the skin. A longitudinal 3-4 cm incision was made between the sternal and clavicular heads of the sternocleidomastoid muscle. The omohyoid was transected. Following longitudinal division of the carotid sheath the jugular vein was partially dissected and retracted laterally. Once 3 cm of common carotid artery (CCA) were isolated, umbilical tape was placed around the proximal 1/3 of the CCA under direct vision. A vessel loop was also placed around the artery just proximal to the umbilical tape in Pots fashion. A 6-0 polypropylene suture was pre-placed in the anterior wall of the CCA, in a "U stitch" configuration, close to the clavicle to facilitate hemostasis upon removal of the arterial sheath at completion of the TCAR procedure.  Heparin was given to obtain a therapeutic activated clotting time >250 seconds prior to arterial access. A 4-French non-stiffened ENHANCE Transcarotid / Peripheral Access set was used, puncturing the  artery with the 21G needle through the pre-placed "U" stitch while holding gentle traction on the umbilical tape to stabilize and centralize the CCA within the incision. Careful attention was paid to the change in CCA shape when using the umbilical tape to control or lift the artery. The micropuncture wire was then advanced 3-4 cm into the CCA and, the 21G needle was removed. The micropuncture sheath was advanced 2-3 cm into the CCA and the wire and dilator were removed. Pulsatile backflow indicated correct positioning. The provided 0.035 J-tipped guidewire was inserted as close as possible to the bifurcation without engaging the lesion. After micropuncture sheath removal, the Transcarotid Arterial Sheath was advanced to the 2.5cm marker and the 0.035" wire and dilator were then removed. Arterial Sheath position was assessed under fluoroscopy in two projections to ensure that the sheath tip was oriented coaxially in the CCA. The Arterial Sheath was sutured to the patient with gentle forward tension. Blood was slowly aspirated followed by flushing with heparinized saline. No ingress of air bubbles through the passive hemostatic valve was observed. The stopcocks were closed. Traction applied to the CCA previously to facilitate access was gently released.   The Flow Controller was connected to the Transcarotid Arterial Sheath, prepared by passively allowing a column of arterial blood to fill the line and connected to the Venous Return Sheath. Passive flow reversal was confirmed with a saline bolus deliver into the venous flow line on the Low flow setting of the Flow Controller. The flow setting was switched to High and the CCA inflow was occluded proximal to the arteriotomy with the vessel loop to achieve active flow reversal. To confirm active flow reversal, a saline bolus was delivered into the venous flow line on the "High" flow setting of the Flow Controller. Angiograms were performed with slow injections of  a small amount of contrast filling just past the lesion to minimize antegrade transmission of micro-bubbles.  Prior to lesion manipulation, heart rate (70bpm) and systolic BP (90-110 MAP) were managed upwards to optimize flow reversal and procedural neuroprotection. The lesion was crossed with an 0.014" ENROUTE guidewire and pre-dilation of the lesion was performed with a 6mm x 40mm rapid exchange 0.014" compatible balloon catheter to 8 atmospheres. Stenting was performed with an 10mm x 40mm ENROUTE Transcarotid stent, sized appropriately to the CCA. There was still some evidence of an approximate 50% stenosis within the stent on fluoroscopy and therefore we elected to post dilate which improved the stenosis to less than 30%.  AP and lateral angiograms (gentle contrast injections) were performed to confirm stent placement and arterial wall stent apposition.    At [facility] case completion, antegrade flow was restored by releasing the vessel loop on the CCA then closing the NPS stopcocks to the flow lines. The Transcarotid Arterial Sheath was removed and the pre-closure suture was tied. Heparin was reversed, the wound was copiously irrigated and hemostasis achieved. The heads of the SCM was re-approximated with a 2-0 Vicryl, the platysma was closed with a 3-0 Vicryl and the skin with 4-0 Monocryl.   The Venous Return Sheath was removed and hemostasis was achieved with brief manual compression.  The patient tolerated the procedure well and was extubated on the table. The patient was moving all four extremities to command prior to transfer to the recovery room.    COMPLICATIONS: none apparent  CONDITION: stable  Norman GORMAN Serve MD [facility] Phone Number: [phone] 07/13/2023 3:37 PM

## 2023-07-13 NOTE — Progress Notes (Addendum)
  Progress Note    07/13/2023 7:53 AM * No surgery found *  Subjective:  he is doing well without any complaints    Vitals:   07/12/23 2320 07/13/23 0427  BP: (!) 153/57 (!) 152/63  Pulse: 76 65  Resp: 18 18  Temp: 97.6 F (36.4 C) 98 F (36.7 C)  SpO2: 98% 99%    Physical Exam: General:  resting comfortably Cardiac:  regular Lungs:  nonlabored Neuro: alert and oriented x4  CBC    Component Value Date/Time   WBC 6.5 07/12/2023 0414   RBC 3.82 (L) 07/12/2023 0414   HGB 10.4 (L) 07/12/2023 0414   HCT 33.0 (L) 07/12/2023 0414   PLT 235 07/12/2023 0414   MCV 86.4 07/12/2023 0414   MCH 27.2 07/12/2023 0414   MCHC 31.5 07/12/2023 0414   RDW 15.0 07/12/2023 0414   LYMPHSABS 1.0 12/16/2017 1650   MONOABS 0.8 12/16/2017 1650   EOSABS 0.1 12/16/2017 1650   BASOSABS 0.0 12/16/2017 1650    BMET    Component Value Date/Time   NA 137 07/12/2023 0414   K 4.5 07/12/2023 0414   CL 105 07/12/2023 0414   CO2 21 (L) 07/12/2023 0414   GLUCOSE 93 07/12/2023 0414   BUN 22 07/12/2023 0414   CREATININE 1.54 (H) 07/12/2023 0414   CALCIUM 8.6 (L) 07/12/2023 0414   GFRNONAA 44 (L) 07/12/2023 0414   GFRAA 47 (L) 12/16/2017 1650    INR No results found for: INR  No intake or output data in the 24 hours ending 07/13/23 0753    Assessment/Plan:  83 y.o. male with symptomatic left carotid artery stenosis   -He is doing well this morning without any recurrent neurological symptoms -He is currently scheduled for left TCAR today with Dr.Gabriell Daigneault. He has been NPO past midnight and remains agreeable for surgery -Will proceed with L TCAR today. All questions answered   Ahmed Holster, [place] [facility] [phone] 07/13/2023 7:53 AM  VASCULAR STAFF ADDENDUM: I have independently interviewed and examined the patient. I agree with the above.  L TCAR today for symptomatic L CAS with TIA (facial droop)  Norman GORMAN Serve MD [facility]  [facility] Phone Number: [phone] 07/13/2023 12:59 PM

## 2023-07-13 NOTE — Plan of Care (Signed)

## 2023-07-13 NOTE — Progress Notes (Signed)
 Progress Note   Patient: Terry Vasquez FMW:[id number] DOB: September 30, 1940 DOA: 07/10/2023     3 DOS: the patient was seen and examined on 07/13/2023   Brief hospital course: Terry Vasquez is a 83 y.o. male with a history of COPD, diabetes mellitus type 2, left carotid artery stenosis, primary hypertension, CKD.  Patient presented from [facility] secondary to TIA with right-sided symptoms and found to have severe left carotid artery stenosis. Vascular surgery consulted. Neurology consulted who advised vascular eval for carotic stenosis. Vascular surgery plan for left carotid revascularization procedure tomorrow.  Assessment and Plan: Severe left carotid artery stenosis Known history of stenosis in the past, on Plavix. Now with CTA evidence of 70-80% stenosis and complicated by right-sided symptoms TIA symptoms. Carotid ultrasound reviewed, Vascular surgery plan for trans carotid revascularization procedure today.   Left sided TIA Patient with symptoms of right-sided facial droop and numbness which have resolved. MRI confirms no stroke. CTA significant for high-grade left carotid stenosis measuring 70-80% stenosis. LDL of 53. Hemoglobin A1C of 6.5%. Transthoracic Echocardiogram ordered and pending. Neurology recommendations pending.  Continue Plavix, aspirin for 4 weeks followed by Aspirin alone. PT/ OT advised no needs.   Diabetes mellitus type 2 Well controlled with hemoglobin A1C of 6.5%. Patient is on Januvia and nateglinide as an outpatient which were held. Continue SSI   Primary hypertension Resume home dose Cardura (for BPH) and lisinopril.   Hyperlipidemia Continue Crestor   COPD Stable. Not on medication management.   CKD stage IIIb Stable.   BPH Patient is on finasteride and Cardura as an outpatient.  Continue finasteride      Out of bed to chair. Incentive spirometry. Nursing supportive care. Fall, aspiration precautions. Diet:  Diet Orders (From  admission, onward)     Start     Ordered   07/13/23 1800  Diet Carb Modified Fluid consistency: Thin; Room service appropriate? Yes  Diet effective now       Question Answer Comment  Diet-HS Snack? Nothing   Calorie Level Medium 1600-2000   Fluid consistency: Thin   Room service appropriate? Yes      07/13/23 1800           DVT prophylaxis: SCD's Start: 07/13/23 1702 enoxaparin (LOVENOX) injection 40 mg Start: 07/10/23 1915  Level of care: Progressive   Code Status: Full Code  Subjective: Patient is seen and examined today morning. He is getting out of bed. No focal weakness, headache. He is awaiting vascular procedure.  Physical Exam: Vitals:   07/13/23 1700 07/13/23 1702 07/13/23 1800 07/13/23 1802  BP: (!) 130/49 (!) 130/49 (!) 139/59 (!) 139/59  Pulse: (!) 59 60 (!) 59 (!) 59  Resp:  18 20 20   Temp:  98.1 F (36.7 C)  98.1 F (36.7 C)  TempSrc:      SpO2: 96% 96% 96% 96%  Weight:      Height:        General - Elderly Caucasian male, no apparent distress HEENT - PERRLA, EOMI, atraumatic head, non tender sinuses. Lung - Clear, no rales, rhonchi, wheezes. Heart - S1, S2 heard, no murmurs, rubs, no pedal edema. Abdomen - Soft, non tender, bowel sounds good Neuro - Alert, awake and oriented x 3, non focal exam. Skin - Warm and dry.  Data Reviewed:      Latest Ref Rng & Units 07/12/2023    4:14 AM 07/10/2023    7:06 PM 12/16/2017    4:50 PM  CBC  WBC  4.0 - 10.5 K/uL 6.5  6.2  8.8   Hemoglobin 13.0 - 17.0 g/dL 89.5  89.5  9.2   Hematocrit 39.0 - 52.0 % 33.0  32.4  29.5   Platelets 150 - 400 K/uL 235  217  207       Latest Ref Rng & Units 07/12/2023    4:14 AM 07/10/2023    7:06 PM 12/16/2017    4:50 PM  BMP  Glucose 70 - 99 mg/dL 93   857   BUN 8 - 23 mg/dL 22   31   Creatinine 9.38 - 1.24 mg/dL 8.45  8.37  8.37   Sodium 135 - 145 mmol/L 137   137   Potassium 3.5 - 5.1 mmol/L 4.5   5.0   Chloride 98 - 111 mmol/L 105   108   CO2 22 - 32 mmol/L 21   22    Calcium 8.9 - 10.3 mg/dL 8.6   8.7    HYBRID OR IMAGING ([facility] ONLY) Result Date: 07/13/2023 There is no interpretation for this exam.  This order is for images obtained during a surgical procedure.  Please See Surgeries Tab for more information regarding the procedure.   VAS US  CAROTID Result Date: 07/12/2023 Carotid Arterial Duplex Study Patient Name:  Terry Vasquez  Date of Exam:   07/12/2023 Medical Rec #: [id number]           Accession #:    [id number] Date of Birth: [birth year]            Patient Gender: M Patient Age:   65 years Exam Location:  [facility] Procedure:      VAS US  CAROTID Referring Phys: [facility] --------------------------------------------------------------------------------  Indications:       TIA, Carotid artery disease and Facial droop. Patient                    transferred from [facility] after CTA indicated                    high-grade left carotid stenosis. Risk Factors:      Hypertension, hyperlipidemia, Diabetes. Other Factors:     CKD IIIb, COPD, on Plavix. Comparison Study:  Prior carotid duplex done 03/01/2018 indicating 1-39% right                    ICA stenosis and 40-59% left ICA stenosis. Patient reports                    carotid done at [facility] 07/09/23, but there is no                    access to records. Performing Technologist: Alberta Lis RVS  Examination Guidelines: A complete evaluation includes B-mode imaging, spectral Doppler, color Doppler, and power Doppler as needed of all accessible portions of each vessel. Bilateral testing is considered an integral part of a complete examination. Limited examinations for reoccurring indications may be performed as noted.  Right Carotid Findings: +----------+--------+--------+--------+---------------------+------------------+           PSV cm/sEDV cm/sStenosisPlaque Description   Comments           +----------+--------+--------+--------+---------------------+------------------+ CCA Prox   125     14                                   intimal thickening +----------+--------+--------+--------+---------------------+------------------+  CCA Distal65      12              heterogenous                            +----------+--------+--------+--------+---------------------+------------------+ ICA Prox  128     29      1-39%   calcific, bulky, and Shadowing                                            irregular                               +----------+--------+--------+--------+---------------------+------------------+ ICA Mid   110     21                                                      +----------+--------+--------+--------+---------------------+------------------+ ICA Distal95      21                                                      +----------+--------+--------+--------+---------------------+------------------+ ECA       173     3       >50%    calcific                                +----------+--------+--------+--------+---------------------+------------------+ +----------+--------+-------+---------+-------------------+           PSV cm/sEDV cmsDescribe Arm Pressure (mmHG) +----------+--------+-------+---------+-------------------+ [id number]             Turbulent                    +----------+--------+-------+---------+-------------------+ +---------+--------+--+--------+--+ VertebralPSV cm/s78EDV cm/s12 +---------+--------+--+--------+--+  Left Carotid Findings: +----------+--------+--------+--------+---------------------+------------------+           PSV cm/sEDV cm/sStenosisPlaque Description   Comments           +----------+--------+--------+--------+---------------------+------------------+ CCA Prox  81      16                                   intimal thickening +----------+--------+--------+--------+---------------------+------------------+ CCA Distal64      15              heterogenous                             +----------+--------+--------+--------+---------------------+------------------+ ICA Prox  219     40      40-59%  calcific, irregular  Shadowing,                                           and diffuse          tortuous           +----------+--------+--------+--------+---------------------+------------------+  ICA Mid   144     23                                                      +----------+--------+--------+--------+---------------------+------------------+ ICA Distal163     32                                                      +----------+--------+--------+--------+---------------------+------------------+ ECA       579     9       >50%    calcific                                +----------+--------+--------+--------+---------------------+------------------+ +----------+--------+--------+--------+-------------------+           PSV cm/sEDV cm/sDescribeArm Pressure (mmHG) +----------+--------+--------+--------+-------------------+ [id number]                                         +----------+--------+--------+--------+-------------------+ +---------+--------+--+--------+--+ VertebralPSV cm/s77EDV cm/s20 +---------+--------+--+--------+--+   Summary: Right Carotid: Velocities in the right ICA are consistent with a 1-39% stenosis.                The ECA appears <50% stenosed. Left Carotid: Velocities in the left ICA are consistent with a 40-59% stenosis               by velocity. However, there is 60-79% stenosis by ICA/CCA ration.               The ECA appears <50% stenosed. Vertebrals:  Bilateral vertebral arteries demonstrate antegrade flow. Subclavians: Normal flow hemodynamics were seen in bilateral subclavian              arteries. *See table(s) above for measurements and observations.  Electronically signed by Gaile New MD on 07/12/2023 at 3:39:40 PM.    Final    ECHOCARDIOGRAM COMPLETE Result Date: 07/12/2023    ECHOCARDIOGRAM REPORT    Patient Name:   Terry Vasquez Date of Exam: 07/12/2023 Medical Rec #:  [id number]          Height:       69.0 in Accession #:    [id number]         Weight:       158.0 lb Date of Birth:  07/05/1940           BSA:          1.869 m Patient Age:    83 years           BP:           135/71 mmHg Patient Gender: M                  HR:           59 bpm. Exam Location:  Inpatient Procedure: 2D Echo, Cardiac Doppler and Color Doppler (Both Spectral and Color            Flow Doppler were utilized during procedure). Indications:    TIA  History:  Patient has prior history of Echocardiogram examinations, most                 recent 09/29/2021. Risk Factors:Hypertension.  Sonographer:    Therisa Crouch Referring Phys: [id number] MOHAMMAD L GARBA IMPRESSIONS  1. Left ventricular ejection fraction, by estimation, is 60 to 65%. The left ventricle has normal function. The left ventricle has no regional wall motion abnormalities. There is mild concentric left ventricular hypertrophy. Left ventricular diastolic parameters are consistent with Grade I diastolic dysfunction (impaired relaxation).  2. Right ventricular systolic function is normal. The right ventricular size is normal.  3. Left atrial size was mildly dilated.  4. The mitral valve is normal in structure. Mild mitral valve regurgitation. No evidence of mitral stenosis.  5. The aortic valve is calcified. There is mild calcification of the aortic valve. Aortic valve regurgitation is not visualized. No aortic stenosis is present.  6. The inferior vena cava is normal in size with greater than 50% respiratory variability, suggesting right atrial pressure of 3 mmHg. FINDINGS  Left Ventricle: Left ventricular ejection fraction, by estimation, is 60 to 65%. The left ventricle has normal function. The left ventricle has no regional wall motion abnormalities. The left ventricular internal cavity size was normal in size. There is  mild concentric left ventricular hypertrophy. Left  ventricular diastolic parameters are consistent with Grade I diastolic dysfunction (impaired relaxation). Right Ventricle: The right ventricular size is normal. No increase in right ventricular wall thickness. Right ventricular systolic function is normal. Left Atrium: Left atrial size was mildly dilated. Right Atrium: Right atrial size was normal in size. Pericardium: There is no evidence of pericardial effusion. Mitral Valve: The mitral valve is normal in structure. Mild mitral valve regurgitation. No evidence of mitral valve stenosis. Tricuspid Valve: The tricuspid valve is normal in structure. Tricuspid valve regurgitation is not demonstrated. No evidence of tricuspid stenosis. Aortic Valve: The aortic valve is calcified. There is mild calcification of the aortic valve. Aortic valve regurgitation is not visualized. No aortic stenosis is present. Pulmonic Valve: The pulmonic valve was normal in structure. Pulmonic valve regurgitation is not visualized. No evidence of pulmonic stenosis. Aorta: The aortic root is normal in size and structure. Venous: The inferior vena cava is normal in size with greater than 50% respiratory variability, suggesting right atrial pressure of 3 mmHg. IAS/Shunts: No atrial level shunt detected by color flow Doppler.  LEFT VENTRICLE PLAX 2D LVIDd:         5.20 cm      Diastology LVIDs:         3.50 cm      LV e' medial:    7.18 cm/s LV PW:         1.20 cm      LV E/e' medial:  13.6 LV IVS:        1.30 cm      LV e' lateral:   6.53 cm/s LVOT diam:     2.00 cm      LV E/e' lateral: 14.9 LVOT Area:     3.14 cm  LV Volumes (MOD) LV vol d, MOD A2C: 93.2 ml LV vol d, MOD A4C: 120.0 ml LV vol s, MOD A2C: 30.2 ml LV vol s, MOD A4C: 42.0 ml LV SV MOD A2C:     63.0 ml LV SV MOD A4C:     120.0 ml LV SV MOD BP:      72.1 ml RIGHT VENTRICLE  IVC RV Basal diam:  3.10 cm     IVC diam: 1.70 cm RV S prime:     12.60 cm/s TAPSE (M-mode): 2.5 cm LEFT ATRIUM             Index LA diam:         4.10 cm 2.19 cm/m LA Vol (A2C):   68.6 ml 36.70 ml/m LA Vol (A4C):   60.3 ml 32.26 ml/m LA Biplane Vol: 64.5 ml 34.51 ml/m   AORTA Ao Root diam: 3.30 cm Ao Asc diam:  2.90 cm MITRAL VALVE MV Area (PHT): 5.02 cm    SHUNTS MV Decel Time: 151 msec    Systemic Diam: 2.00 cm MV E velocity: 97.50 cm/s MV A velocity: 86.50 cm/s MV E/A ratio:  1.13 Aditya Sabharwal Electronically signed by Ria Commander Signature Date/Time: 07/12/2023/1:38:18 PM    Final     Family Communication: Discussed with patient, family at bedside. They understand and agree. All questions answered.  Disposition: Status is: Inpatient Remains inpatient appropriate because: vascular surgery  Planned Discharge Destination: Home with Home Health     Time spent: 37 minutes  Author: Concepcion Riser, MD 07/13/2023 6:15 PM Secure chat 7am to 7pm For on call review [email or web address].

## 2023-07-13 NOTE — Anesthesia Postprocedure Evaluation (Signed)
 Anesthesia Post Note  Patient: Carl Butner Fernicola  Procedure(s) Performed: LERETHA ARTERY REVASCULARIZATION (TCAR) (Left: Neck)     Patient location during evaluation: PACU Anesthesia Type: General Level of consciousness: awake and alert Pain management: pain level controlled Vital Signs Assessment: post-procedure vital signs reviewed and stable Respiratory status: spontaneous breathing, nonlabored ventilation, respiratory function stable and patient connected to nasal cannula oxygen Cardiovascular status: blood pressure returned to baseline and stable Postop Assessment: no apparent nausea or vomiting Anesthetic complications: no   No notable events documented.  Last Vitals:  Vitals:   07/13/23 1700 07/13/23 1702  BP: (!) 130/49 (!) 130/49  Pulse: (!) 59 60  Resp:  18  Temp:  36.7 C  SpO2: 96% 96%    Last Pain:  Vitals:   07/13/23 1652  TempSrc: Oral  PainSc:                  Thom JONELLE Peoples

## 2023-07-13 NOTE — Progress Notes (Signed)
 SLP Cancellation Note  Patient Details Name: Terry Vasquez MRN: [id number] DOB: 1940-03-19   Cancelled treatment:       Reason Eval/Treat Not Completed: Patient at procedure or test/unavailable. Unable to complete SLE at this time, as pt is currently off unit. Will continue efforts.   Mattheu Brodersen B. Dory, MSP, CCC-SLP Speech Language Pathologist Office: [phone]  [name] 07/13/2023, 10:44 AM

## 2023-07-13 NOTE — Transfer of Care (Signed)
 Immediate Anesthesia Transfer of Care Note  Patient: Terry Vasquez  Procedure(s) Performed: LERETHA ARTERY REVASCULARIZATION (TCAR) (Left: Neck)  Patient Location: PACU  Anesthesia Type:General  Level of Consciousness: awake, alert , oriented, and patient cooperative  Airway & Oxygen Therapy: Patient Spontanous Breathing  Post-op Assessment: Report given to RN and Post -op Vital signs reviewed and stable  Post vital signs: Reviewed and stable  Last Vitals:  Vitals Value Taken Time  BP 156/65 1558  Temp    Pulse 69 1558  Resp 20 1558  SpO2 96% 1558    Last Pain:  Vitals:   07/13/23 1116  TempSrc:   PainSc: 0-No pain      Patients Stated Pain Goal: 3 (07/13/23 1116)  Complications: No notable events documented.

## 2023-07-13 NOTE — Anesthesia Procedure Notes (Signed)
 Procedure Name: Intubation Date/Time: 07/13/2023 2:03 PM  Performed by: Cindie Doyal CROME, CRNAPre-anesthesia Checklist: Patient identified, Emergency Drugs available, Suction available and Patient being monitored Patient Re-evaluated:Patient Re-evaluated prior to induction Oxygen Delivery Method: Circle System Utilized Preoxygenation: Pre-oxygenation with 100% oxygen Induction Type: IV induction Ventilation: Mask ventilation without difficulty Laryngoscope Size: Mac and 4 Grade View: Grade I Tube type: Oral Tube size: 7.5 mm Number of attempts: 1 Airway Equipment and Method: Stylet and Oral airway Placement Confirmation: ETT inserted through vocal cords under direct vision, positive ETCO2 and breath sounds checked- equal and bilateral Secured at: 22 cm Tube secured with: Tape Dental Injury: Teeth and Oropharynx as per pre-operative assessment
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
## 2023-07-14 ENCOUNTER — Encounter (HOSPITAL_COMMUNITY): Payer: Self-pay | Admitting: Vascular Surgery

## 2023-07-14 DIAGNOSIS — E119 Type 2 diabetes mellitus without complications: Secondary | ICD-10-CM | POA: Diagnosis not present

## 2023-07-14 DIAGNOSIS — E785 Hyperlipidemia, unspecified: Secondary | ICD-10-CM | POA: Diagnosis not present

## 2023-07-14 DIAGNOSIS — N1831 Chronic kidney disease, stage 3a: Secondary | ICD-10-CM | POA: Diagnosis not present

## 2023-07-14 DIAGNOSIS — G459 Transient cerebral ischemic attack, unspecified: Secondary | ICD-10-CM | POA: Diagnosis not present

## 2023-07-14 DIAGNOSIS — I6522 Occlusion and stenosis of left carotid artery: Secondary | ICD-10-CM | POA: Diagnosis not present

## 2023-07-14 DIAGNOSIS — I10 Essential (primary) hypertension: Secondary | ICD-10-CM | POA: Diagnosis not present

## 2023-07-14 DIAGNOSIS — Z48812 Encounter for surgical aftercare following surgery on the circulatory system: Secondary | ICD-10-CM

## 2023-07-14 LAB — CBC
HCT: 30.6 % — ABNORMAL LOW (ref 39.0–52.0)
Hemoglobin: 9.9 g/dL — ABNORMAL LOW (ref 13.0–17.0)
MCH: 27.7 pg (ref 26.0–34.0)
MCHC: 32.4 g/dL (ref 30.0–36.0)
MCV: 85.7 fL (ref 80.0–100.0)
Platelets: 241 10*3/uL (ref 150–400)
RBC: 3.57 MIL/uL — ABNORMAL LOW (ref 4.22–5.81)
RDW: 15.4 % (ref 11.5–15.5)
WBC: 8.1 10*3/uL (ref 4.0–10.5)
nRBC: 0 % (ref 0.0–0.2)

## 2023-07-14 LAB — BASIC METABOLIC PANEL WITH GFR
Anion gap: 10 (ref 5–15)
BUN: 27 mg/dL — ABNORMAL HIGH (ref 8–23)
CO2: 19 mmol/L — ABNORMAL LOW (ref 22–32)
Calcium: 8.3 mg/dL — ABNORMAL LOW (ref 8.9–10.3)
Chloride: 104 mmol/L (ref 98–111)
Creatinine, Ser: 1.63 mg/dL — ABNORMAL HIGH (ref 0.61–1.24)
GFR, Estimated: 42 mL/min — ABNORMAL LOW (ref >60)
Glucose, Bld: 204 mg/dL — ABNORMAL HIGH (ref 70–99)
Potassium: 4.8 mmol/L (ref 3.5–5.1)
Sodium: 133 mmol/L — ABNORMAL LOW (ref 135–145)

## 2023-07-14 LAB — GLUCOSE, CAPILLARY
Glucose-Capillary: 218 mg/dL — ABNORMAL HIGH (ref 70–99)
Glucose-Capillary: 222 mg/dL — ABNORMAL HIGH (ref 70–99)

## 2023-07-14 MED ORDER — CLOPIDOGREL BISULFATE 75 MG PO TABS: 75.0000 mg | ORAL_TABLET | Freq: Every day | ORAL | Status: AC

## 2023-07-14 MED ORDER — ORAL CARE MOUTH RINSE: 15.0000 mL | OROMUCOSAL | Status: DC | PRN

## 2023-07-14 MED ORDER — ASPIRIN 81 MG PO TBEC
81.0000 mg | DELAYED_RELEASE_TABLET | Freq: Every day | ORAL | Status: AC
Start: 2023-07-15 — End: ?

## 2023-07-14 NOTE — TOC Transition Note (Signed)
 Transition of Care (TOC) - Discharge Note Rayfield Gobble RN, BSN Transitions of Care Unit 4E- RN Case Manager See Treatment Team for direct phone #   Patient Details  Name: Terry Vasquez MRN: [id number] Date of Birth: March 29, 1940  Transition of Care [facility]) CM/SW Contact:  Gobble Rayfield Hurst, RN Phone Number: 07/14/2023, 9:59 AM   Clinical Narrative:    S/P TCAR, stable for transition home today. No HH or DME needs noted. Family to transport home.    Final next level of care: Home/Self Care Barriers to Discharge: Barriers Resolved   Patient Goals and CMS Choice Patient states their goals for this hospitalization and ongoing recovery are:: return home   Choice offered to / list presented to : NA      Discharge Placement                 Home      Discharge Plan and Services Additional resources added to the After Visit Summary for   In-house Referral: NA Discharge Planning Services: NA Post Acute Care Choice: NA          DME Arranged: N/A DME Agency: NA       HH Arranged: NA HH Agency: NA        Social Drivers of Health (SDOH) Interventions SDOH Screenings   Food Insecurity: No Food Insecurity (07/10/2023)  Housing: Low Risk  (07/10/2023)  Transportation Needs: No Transportation Needs (07/10/2023)  Utilities: Not At Risk (07/10/2023)  Social Connections: Socially Integrated (07/10/2023)  Tobacco Use: Low Risk  (07/11/2023)     Readmission Risk Interventions    07/14/2023    9:59 AM  Readmission Risk Prevention Plan  Post Dischage Appt Complete  Medication Screening Complete  Transportation Screening Complete

## 2023-07-14 NOTE — Progress Notes (Addendum)
  Progress Note    07/14/2023 8:15 AM 1 Day Post-Op  Subjective:  no complaints    Vitals:   07/14/23 0333 07/14/23 0807  BP: 127/60 (!) 126/57  Pulse: 75 72  Resp: 18 17  Temp: 97.8 F (36.6 C) (!) 97.5 F (36.4 C)  SpO2: 100% 100%    Physical Exam: General:  sitting up in bed, NAD Cardiac:  regular Lungs:  nonlabored Incisions:  left sided neck incision intact and dry with mild bruising, no hematoma Extremities:  moving all extremities equally Neuro: no slurred speech or tongue deviation  CBC    Component Value Date/Time   WBC 8.1 07/14/2023 0535   RBC 3.57 (L) 07/14/2023 0535   HGB 9.9 (L) 07/14/2023 0535   HCT 30.6 (L) 07/14/2023 0535   PLT 241 07/14/2023 0535   MCV 85.7 07/14/2023 0535   MCH 27.7 07/14/2023 0535   MCHC 32.4 07/14/2023 0535   RDW 15.4 07/14/2023 0535   LYMPHSABS 1.0 12/16/2017 1650   MONOABS 0.8 12/16/2017 1650   EOSABS 0.1 12/16/2017 1650   BASOSABS 0.0 12/16/2017 1650    BMET    Component Value Date/Time   NA 133 (L) 07/14/2023 0535   K 4.8 07/14/2023 0535   CL 104 07/14/2023 0535   CO2 19 (L) 07/14/2023 0535   GLUCOSE 204 (H) 07/14/2023 0535   BUN 27 (H) 07/14/2023 0535   CREATININE 1.63 (H) 07/14/2023 0535   CALCIUM 8.3 (L) 07/14/2023 0535   GFRNONAA 42 (L) 07/14/2023 0535   GFRAA 47 (L) 12/16/2017 1650    INR No results found for: INR   Intake/Output Summary (Last 24 hours) at 07/14/2023 0815 Last data filed at 07/14/2023 0656 Gross per 24 hour  Intake 2770.57 ml  Output 535 ml  Net 2235.57 ml      Assessment/Plan:  83 y.o. male is 1 day post op, s/p: L TCAR   - He is doing well this morning without any complaints.  He denies any repeat strokelike symptoms - He remains hemodynamically stable.  His hemoglobin is stable at 9.9 without further signs of blood loss - His left-sided neck incision is clean, dry, and intact.  There is mild bruising, however no hematoma - He has mobilized and voided postop without any  issue.  He is also swallowing without any difficulty - Continue daily aspirin, Plavix, statin.  He is stable for discharge from a vascular perspective.  Will arrange follow-up in 4 weeks with carotid duplex   Ahmed Holster, PA-C [facility] [phone] 07/14/2023 8:15 AM  VASCULAR STAFF ADDENDUM: I have independently interviewed and examined the patient. I agree with the above.  Recovering well.  Left TCAR for symptomatic carotid stenosis with TIA and right facial droop.  Okay for discharge today from surgical perspective. Plan to continue dual antiplatelet therapy and statin and follow-up in 4 weeks which will be arranged.  Norman GORMAN Serve MD [facility] Phone Number: [phone] 07/14/2023 8:34 AM
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
## 2023-07-14 NOTE — Discharge Summary (Signed)
 Physician Discharge Summary   Patient: Terry Vasquez MRN: [id number] DOB: 1940/08/28  Admit date:     07/10/2023  Discharge date: 07/14/23  Discharge Physician: Concepcion Riser   PCP: Trinidad Glisson, MD   Recommendations at discharge:  {Tip this will not be part of the note when signed- Example include specific recommendations for outpatient follow-up, pending tests to follow-up on. (Optional):26781}  ***  Discharge Diagnoses: Principal Problem:   Left carotid stenosis Active Problems:   Carotid stenosis   Controlled type 2 diabetes mellitus without complication, without long-term current use of insulin (HCC)   Essential hypertension   CKD (chronic kidney disease) stage 3, GFR 30-59 ml/min (HCC)   COPD (chronic obstructive pulmonary disease) (HCC)   Asbestosis (HCC)  Resolved Problems:   * No resolved hospital problems. *  Hospital Course: Terry Vasquez is a 83 y.o. male with a history of COPD, diabetes mellitus type 2, left carotid artery stenosis, primary hypertension, CKD.  Patient presented from [facility] secondary to TIA with right-sided symptoms and found to have severe left carotid artery stenosis. Vascular surgery consulted. Neurology consulted.  Assessment and Plan: No notes have been filed under this hospital service. Service: Hospitalist     {Tip this will not be part of the note when signed Body mass index is 23.33 kg/m. , ,  (Optional):26781}  {(NOTE) Pain control PDMP Statment (Optional):26782} Consultants: *** Procedures performed: ***  Disposition: {Plan; Disposition:26390} Diet recommendation:  Discharge Diet Orders (From admission, onward)     Start     Ordered   07/14/23 0000  Diet - low sodium heart healthy        07/14/23 0957           {Diet_Plan:26776} DISCHARGE MEDICATION: Allergies as of 07/14/2023       Reactions   Motrin [ibuprofen] Other (See Comments)   Nosebleed, loss of consiousness   Actos  [pioglitazone] Other (See Comments)   Hypoglycemia, Dehydration   Glipizide Other (See Comments)   Hypoglycemia, Dehydration   Janumet [sitagliptin Phos-metformin Hcl] Other (See Comments)   Hypoglycemia, Dehydration   Jardiance [empagliflozin] Other (See Comments)   Hypoglycemia, Dehydration   Metformin And Related Other (See Comments)   Reaction type/severity unknown   Trulicity [dulaglutide] Nausea And Vomiting        Medication List     TAKE these medications    albuterol  108 (90 Base) MCG/ACT inhaler Commonly known as: VENTOLIN  HFA Inhale 1-2 puffs into the lungs every 6 (six) hours as needed for wheezing or shortness of breath.   aspirin EC 81 MG tablet Take 1 tablet (81 mg total) by mouth daily. Swallow whole. Start taking on: July 15, 2023   bumetanide 0.5 MG tablet Commonly known as: BUMEX Take 0.5 mg by mouth daily.   CALCIUM PO Take 1 tablet by mouth daily. Strength unknown   cholecalciferol 10 MCG (400 UNIT) Tabs tablet Commonly known as: VITAMIN D3 Take 400 Units by mouth daily.   clopidogrel 75 MG tablet Commonly known as: PLAVIX Take 1 tablet (75 mg total) by mouth daily for 28 days.   doxazosin 4 MG tablet Commonly known as: CARDURA Take 4 mg by mouth daily.   finasteride 5 MG tablet Commonly known as: PROSCAR Take 5 mg by mouth daily.   fluticasone furoate-vilanterol 200-25 MCG/ACT Aepb Commonly known as: BREO ELLIPTA Inhale 1 puff into the lungs daily.   Januvia 50 MG tablet Generic drug: sitaGLIPtin Take 50 mg by mouth daily.  lisinopril 5 MG tablet Commonly known as: ZESTRIL Take 5 mg by mouth 2 (two) times daily.   nateglinide 120 MG tablet Commonly known as: STARLIX Take 60 mg by mouth 3 (three) times daily with meals.   Omega-3 1000 MG Caps Take 1,000 mg by mouth daily.   rosuvastatin 10 MG tablet Commonly known as: CRESTOR 10 mg daily.        Follow-up Information     [facility] Follow up in 4 week(s).   Specialty: Vascular Surgery Why: Office will call to arrange your appt(s) Contact information: [address] [place]  [address] [phone]               Discharge Exam: [name]   07/10/23 1747 07/13/23 1048  Weight: 71.7 kg 71.7 kg   ***  Condition at discharge: {DC Condition:26389}  The results of significant diagnostics from this hospitalization (including imaging, microbiology, ancillary and laboratory) are listed below for reference.   Imaging Studies: HYBRID OR IMAGING ([facility] ONLY) Result Date: 07/13/2023 There is no interpretation for this exam.  This order is for images obtained during a surgical procedure.  Please See Surgeries Tab for more information regarding the procedure.   VAS US  CAROTID Result Date: 07/12/2023 Carotid Arterial Duplex Study Patient Name:  Terry Vasquez  Date of Exam:   07/12/2023 Medical Rec #: [id number]           Accession #:    [id number] Date of Birth: 1940-12-11            Patient Gender: M Patient Age:   31 years Exam Location:  [facility] Procedure:      VAS US  CAROTID Referring Phys: [facility] --------------------------------------------------------------------------------  Indications:       TIA, Carotid artery disease and Facial droop. Patient                    transferred from [facility] after CTA indicated                    high-grade left carotid stenosis. Risk Factors:      Hypertension, hyperlipidemia, Diabetes. Other Factors:     CKD IIIb, COPD, on Plavix. Comparison Study:  Prior carotid duplex done 03/01/2018 indicating 1-39% right                    ICA stenosis and 40-59% left ICA stenosis. Patient reports                    carotid done at [facility] 07/09/23, but there is no                    access to records. Performing Technologist: Alberta Lis RVS  Examination Guidelines: A complete evaluation includes B-mode imaging,  spectral Doppler, color Doppler, and power Doppler as needed of all accessible portions of each vessel. Bilateral testing is considered an integral part of a complete examination. Limited examinations for reoccurring indications may be performed as noted.  Right Carotid Findings: +----------+--------+--------+--------+---------------------+------------------+           PSV cm/sEDV cm/sStenosisPlaque Description   Comments           +----------+--------+--------+--------+---------------------+------------------+ CCA Prox  125     14  intimal thickening +----------+--------+--------+--------+---------------------+------------------+ CCA Distal65      12              heterogenous                            +----------+--------+--------+--------+---------------------+------------------+ ICA Prox  128     29      1-39%   calcific, bulky, and Shadowing                                            irregular                               +----------+--------+--------+--------+---------------------+------------------+ ICA Mid   110     21                                                      +----------+--------+--------+--------+---------------------+------------------+ ICA Distal95      21                                                      +----------+--------+--------+--------+---------------------+------------------+ ECA       173     3       >50%    calcific                                +----------+--------+--------+--------+---------------------+------------------+ +----------+--------+-------+---------+-------------------+           PSV cm/sEDV cmsDescribe Arm Pressure (mmHG) +----------+--------+-------+---------+-------------------+ [id number]             Turbulent                    +----------+--------+-------+---------+-------------------+ +---------+--------+--+--------+--+ VertebralPSV cm/s78EDV cm/s12  +---------+--------+--+--------+--+  Left Carotid Findings: +----------+--------+--------+--------+---------------------+------------------+           PSV cm/sEDV cm/sStenosisPlaque Description   Comments           +----------+--------+--------+--------+---------------------+------------------+ CCA Prox  81      16                                   intimal thickening +----------+--------+--------+--------+---------------------+------------------+ CCA Distal64      15              heterogenous                            +----------+--------+--------+--------+---------------------+------------------+ ICA Prox  219     40      40-59%  calcific, irregular  Shadowing,                                           and diffuse          tortuous           +----------+--------+--------+--------+---------------------+------------------+  ICA Mid   144     23                                                      +----------+--------+--------+--------+---------------------+------------------+ ICA Distal163     32                                                      +----------+--------+--------+--------+---------------------+------------------+ ECA       579     9       >50%    calcific                                +----------+--------+--------+--------+---------------------+------------------+ +----------+--------+--------+--------+-------------------+           PSV cm/sEDV cm/sDescribeArm Pressure (mmHG) +----------+--------+--------+--------+-------------------+ [id number]                                         +----------+--------+--------+--------+-------------------+ +---------+--------+--+--------+--+ VertebralPSV cm/s77EDV cm/s20 +---------+--------+--+--------+--+   Summary: Right Carotid: Velocities in the right ICA are consistent with a 1-39% stenosis.                The ECA appears <50% stenosed. Left Carotid: Velocities in the left ICA are  consistent with a 40-59% stenosis               by velocity. However, there is 60-79% stenosis by ICA/CCA ration.               The ECA appears <50% stenosed. Vertebrals:  Bilateral vertebral arteries demonstrate antegrade flow. Subclavians: Normal flow hemodynamics were seen in bilateral subclavian              arteries. *See table(s) above for measurements and observations.  Electronically signed by Gaile New MD on 07/12/2023 at 3:39:40 PM.    Final    ECHOCARDIOGRAM COMPLETE Result Date: 07/12/2023    ECHOCARDIOGRAM REPORT   Patient Name:   Terry Vasquez Date of Exam: 07/12/2023 Medical Rec #:  [id number]          Height:       69.0 in Accession #:    [id number]         Weight:       158.0 lb Date of Birth:  12-Aug-1940           BSA:          1.869 m Patient Age:    83 years           BP:           135/71 mmHg Patient Gender: M                  HR:           59 bpm. Exam Location:  Inpatient Procedure: 2D Echo, Cardiac Doppler and Color Doppler (Both Spectral and Color            Flow Doppler were utilized during procedure). Indications:    TIA  History:  Patient has prior history of Echocardiogram examinations, most                 recent 09/29/2021. Risk Factors:Hypertension.  Sonographer:    Therisa Crouch Referring Phys: [id number] MOHAMMAD L GARBA IMPRESSIONS  1. Left ventricular ejection fraction, by estimation, is 60 to 65%. The left ventricle has normal function. The left ventricle has no regional wall motion abnormalities. There is mild concentric left ventricular hypertrophy. Left ventricular diastolic parameters are consistent with Grade I diastolic dysfunction (impaired relaxation).  2. Right ventricular systolic function is normal. The right ventricular size is normal.  3. Left atrial size was mildly dilated.  4. The mitral valve is normal in structure. Mild mitral valve regurgitation. No evidence of mitral stenosis.  5. The aortic valve is calcified. There is mild calcification of the aortic  valve. Aortic valve regurgitation is not visualized. No aortic stenosis is present.  6. The inferior vena cava is normal in size with greater than 50% respiratory variability, suggesting right atrial pressure of 3 mmHg. FINDINGS  Left Ventricle: Left ventricular ejection fraction, by estimation, is 60 to 65%. The left ventricle has normal function. The left ventricle has no regional wall motion abnormalities. The left ventricular internal cavity size was normal in size. There is  mild concentric left ventricular hypertrophy. Left ventricular diastolic parameters are consistent with Grade I diastolic dysfunction (impaired relaxation). Right Ventricle: The right ventricular size is normal. No increase in right ventricular wall thickness. Right ventricular systolic function is normal. Left Atrium: Left atrial size was mildly dilated. Right Atrium: Right atrial size was normal in size. Pericardium: There is no evidence of pericardial effusion. Mitral Valve: The mitral valve is normal in structure. Mild mitral valve regurgitation. No evidence of mitral valve stenosis. Tricuspid Valve: The tricuspid valve is normal in structure. Tricuspid valve regurgitation is not demonstrated. No evidence of tricuspid stenosis. Aortic Valve: The aortic valve is calcified. There is mild calcification of the aortic valve. Aortic valve regurgitation is not visualized. No aortic stenosis is present. Pulmonic Valve: The pulmonic valve was normal in structure. Pulmonic valve regurgitation is not visualized. No evidence of pulmonic stenosis. Aorta: The aortic root is normal in size and structure. Venous: The inferior vena cava is normal in size with greater than 50% respiratory variability, suggesting right atrial pressure of 3 mmHg. IAS/Shunts: No atrial level shunt detected by color flow Doppler.  LEFT VENTRICLE PLAX 2D LVIDd:         5.20 cm      Diastology LVIDs:         3.50 cm      LV e' medial:    7.18 cm/s LV PW:         1.20 cm       LV E/e' medial:  13.6 LV IVS:        1.30 cm      LV e' lateral:   6.53 cm/s LVOT diam:     2.00 cm      LV E/e' lateral: 14.9 LVOT Area:     3.14 cm  LV Volumes (MOD) LV vol d, MOD A2C: 93.2 ml LV vol d, MOD A4C: 120.0 ml LV vol s, MOD A2C: 30.2 ml LV vol s, MOD A4C: 42.0 ml LV SV MOD A2C:     63.0 ml LV SV MOD A4C:     120.0 ml LV SV MOD BP:      72.1 ml RIGHT VENTRICLE  IVC RV Basal diam:  3.10 cm     IVC diam: 1.70 cm RV S prime:     12.60 cm/s TAPSE (M-mode): 2.5 cm LEFT ATRIUM             Index LA diam:        4.10 cm 2.19 cm/m LA Vol (A2C):   68.6 ml 36.70 ml/m LA Vol (A4C):   60.3 ml 32.26 ml/m LA Biplane Vol: 64.5 ml 34.51 ml/m   AORTA Ao Root diam: 3.30 cm Ao Asc diam:  2.90 cm MITRAL VALVE MV Area (PHT): 5.02 cm    SHUNTS MV Decel Time: 151 msec    Systemic Diam: 2.00 cm MV E velocity: 97.50 cm/s MV A velocity: 86.50 cm/s MV E/A ratio:  1.13 Aditya Sabharwal Electronically signed by Ria Commander Signature Date/Time: 07/12/2023/1:38:18 PM    Final     Microbiology: Results for orders placed or performed during the hospital encounter of 07/10/23  Surgical pcr screen     Status: None   Collection Time: 07/13/23 10:21 AM   Specimen: Nasal Mucosa; Nasal Swab  Result Value Ref Range Status   MRSA, PCR NEGATIVE NEGATIVE Final   Staphylococcus aureus NEGATIVE NEGATIVE Final    Comment: (NOTE) The Xpert SA Assay (FDA approved for NASAL specimens in patients 57 years of age and older), is one component of a comprehensive surveillance program. It is not intended to diagnose infection nor to guide or monitor treatment. Performed at [facility], [address] [place], [place] [address]     Labs: CBC: Recent Labs  Lab 07/10/23 1906 07/12/23 0414 07/14/23 0535  WBC 6.2 6.5 8.1  HGB 10.4* 10.4* 9.9*  HCT 32.4* 33.0* 30.6*  MCV 86.6 86.4 85.7  PLT 217 235 241   Basic Metabolic Panel: Recent Labs  Lab 07/10/23 1906 07/12/23 0414 07/14/23 0535  NA  --  137  133*  K  --  4.5 4.8  CL  --  105 104  CO2  --  21* 19*  GLUCOSE  --  93 204*  BUN  --  22 27*  CREATININE 1.62* 1.54* 1.63*  CALCIUM  --  8.6* 8.3*   Liver Function Tests: No results for input(s): AST, ALT, ALKPHOS, BILITOT, PROT, ALBUMIN in the last 168 hours. CBG: Recent Labs  Lab 07/13/23 1037 07/13/23 1226 07/13/23 1615 07/13/23 2202 07/14/23 0618  GLUCAP 106* 89 93 243* 218*    Discharge time spent: {LESS THAN/GREATER THAN:26388} 30 minutes.  Signed: Concepcion Riser, MD [facility] 07/14/2023

## 2023-07-14 NOTE — Progress Notes (Signed)
 Discharge instructions reviewed with pt and his family (son and wife).  Copy of instructions given to pt. No new scripts. Stroke education reviewed with pt and his family, handout provided in discharge packet and pt has stroke booklet (as pt had a TIA prior to admission) pt/family able to teach back the signs and symptoms of stroke/TIAs. Pt getting dressed at this time.  Pt will be d/c'd via wheelchair with belongings and will be escorted by staff.   Alfreida Steffenhagen,RN SWOT

## 2023-07-14 NOTE — Plan of Care (Signed)
 Problem: Education: Goal: Knowledge of General Education information will improve Description: Including pain rating scale, medication(s)/side effects and non-pharmacologic comfort measures Outcome: Completed/Met   Problem: Health Behavior/Discharge Planning: Goal: Ability to manage health-related needs will improve Outcome: Completed/Met   Problem: Clinical Measurements: Goal: Ability to maintain clinical measurements within normal limits will improve Outcome: Completed/Met Goal: Will remain free from infection Outcome: Completed/Met Goal: Diagnostic test results will improve Outcome: Completed/Met Goal: Respiratory complications will improve Outcome: Completed/Met Goal: Cardiovascular complication will be avoided Outcome: Completed/Met   Problem: Activity: Goal: Risk for activity intolerance will decrease Outcome: Completed/Met   Problem: Nutrition: Goal: Adequate nutrition will be maintained Outcome: Completed/Met   Problem: Coping: Goal: Level of anxiety will decrease Outcome: Completed/Met   Problem: Elimination: Goal: Will not experience complications related to bowel motility Outcome: Completed/Met Goal: Will not experience complications related to urinary retention Outcome: Completed/Met   Problem: Pain Managment: Goal: General experience of comfort will improve and/or be controlled Outcome: Completed/Met   Problem: Safety: Goal: Ability to remain free from injury will improve Outcome: Completed/Met   Problem: Skin Integrity: Goal: Risk for impaired skin integrity will decrease Outcome: Completed/Met   Problem: Education: Goal: Knowledge of disease or condition will improve Outcome: Completed/Met Goal: Knowledge of secondary prevention will improve (MUST DOCUMENT ALL) Outcome: Completed/Met Goal: Knowledge of patient specific risk factors will improve (DELETE if not current risk factor) Outcome: Completed/Met   Problem: Ischemic Stroke/TIA Tissue  Perfusion: Goal: Complications of ischemic stroke/TIA will be minimized Outcome: Completed/Met   Problem: Coping: Goal: Will verbalize positive feelings about self Outcome: Completed/Met Goal: Will identify appropriate support needs Outcome: Completed/Met   Problem: Health Behavior/Discharge Planning: Goal: Ability to manage health-related needs will improve Outcome: Completed/Met Goal: Goals will be collaboratively established with patient/family Outcome: Completed/Met   Problem: Self-Care: Goal: Ability to participate in self-care as condition permits will improve Outcome: Completed/Met Goal: Verbalization of feelings and concerns over difficulty with self-care will improve Outcome: Completed/Met Goal: Ability to communicate needs accurately will improve Outcome: Completed/Met   Problem: Nutrition: Goal: Risk of aspiration will decrease Outcome: Completed/Met Goal: Dietary intake will improve Outcome: Completed/Met   Problem: Education: Goal: Knowledge of General Education information will improve Description: Including pain rating scale, medication(s)/side effects and non-pharmacologic comfort measures Outcome: Completed/Met   Problem: Health Behavior/Discharge Planning: Goal: Ability to manage health-related needs will improve Outcome: Completed/Met   Problem: Clinical Measurements: Goal: Ability to maintain clinical measurements within normal limits will improve Outcome: Completed/Met Goal: Will remain free from infection Outcome: Completed/Met Goal: Diagnostic test results will improve Outcome: Completed/Met Goal: Respiratory complications will improve Outcome: Completed/Met Goal: Cardiovascular complication will be avoided Outcome: Completed/Met   Problem: Activity: Goal: Risk for activity intolerance will decrease Outcome: Completed/Met   Problem: Nutrition: Goal: Adequate nutrition will be maintained Outcome: Completed/Met   Problem: Coping: Goal:  Level of anxiety will decrease Outcome: Completed/Met   Problem: Elimination: Goal: Will not experience complications related to bowel motility Outcome: Completed/Met Goal: Will not experience complications related to urinary retention Outcome: Completed/Met   Problem: Pain Managment: Goal: General experience of comfort will improve and/or be controlled Outcome: Completed/Met   Problem: Safety: Goal: Ability to remain free from injury will improve Outcome: Completed/Met   Problem: Skin Integrity: Goal: Risk for impaired skin integrity will decrease Outcome: Completed/Met   Problem: Education: Goal: Knowledge of disease or condition will improve Outcome: Completed/Met Goal: Knowledge of secondary prevention will improve (MUST DOCUMENT ALL) Outcome: Completed/Met Goal: Knowledge of patient specific risk factors  will improve (DELETE if not current risk factor) Outcome: Completed/Met   Problem: Ischemic Stroke/TIA Tissue Perfusion: Goal: Complications of ischemic stroke/TIA will be minimized Outcome: Completed/Met

## 2023-07-14 NOTE — Evaluation (Signed)
 Speech Language Pathology Evaluation Patient Details Name: Terry Vasquez MRN: [id number] DOB: [birth year] Today's Date: 07/14/2023 Time: 9074-9056 SLP Time Calculation (min) (ACUTE ONLY): 18 min  Problem List:  Patient Active Problem List   Diagnosis Date Noted   Left carotid stenosis 07/10/2023   Controlled type 2 diabetes mellitus without complication, without long-term current use of insulin (HCC) 07/10/2023   Essential hypertension 07/10/2023   CKD (chronic kidney disease) stage 3, GFR 30-59 ml/min (HCC) 07/10/2023   COPD (chronic obstructive pulmonary disease) (HCC) 07/10/2023   Asbestosis (HCC) 07/10/2023   Carotid stenosis 03/01/2018   Past Medical History:  Past Medical History:  Diagnosis Date   Complication of anesthesia    Pt told not have any spinal tap procedures int he future   COPD (chronic obstructive pulmonary disease) (HCC)    Diabetes mellitus without complication (HCC)    Hypertension    Inguinal hernia    Past Surgical History:  Past Surgical History:  Procedure Laterality Date   APPENDECTOMY     INGUINAL HERNIA REPAIR     SHOULDER SURGERY Right    HPI:  Pt is a 83 y.o male transferred from [facility] for signs of stroke, workup negative. CTA showed high grade L carotid stenosis   PMH: COPD, DM,  HTN, CKD   Assessment / Plan / Recommendation Clinical Impression  Patient was evaluated via the Cognistat to assess cognitive-linguistic functioning. Patient scored WFL on all substests with the exception of mild deficits in calculations. Patient reports being at baseline cognitively. Patient was 100% intelligibile at the conversational level and independently recalled prior job/medical history, home life and children. No further acute ST needs.     SLP Assessment  SLP Recommendation/Assessment: Patient does not need any further Speech Language Pathology Services SLP Visit Diagnosis: Cognitive communication deficit (R41.841)     Assistance  Recommended at Discharge  Intermittent Supervision/Assistance  Functional Status Assessment Patient has not had a recent decline in their functional status     SLP Evaluation Cognition  Overall Cognitive Status: Within Functional Limits for tasks assessed Arousal/Alertness: Awake/alert Orientation Level: Oriented X4 Year: 2025 Month: July Day of Week: Correct Attention: Focused;Sustained Focused Attention: Appears intact Sustained Attention: Appears intact Memory: Appears intact Awareness: Appears intact Problem Solving: Impaired (likely at baseline) Problem Solving Impairment: Verbal basic       Comprehension  Auditory Comprehension Overall Auditory Comprehension: Appears within functional limits for tasks assessed Yes/No Questions: Within Functional Limits Commands: Within Functional Limits Conversation: Complex    Expression Expression Primary Mode of Expression: Verbal Verbal Expression Overall Verbal Expression: Appears within functional limits for tasks assessed   Oral / Motor  Oral Motor/Sensory Function Overall Oral Motor/Sensory Function: Within functional limits Motor Speech Overall Motor Speech: Appears within functional limits for tasks assessed            Jesalyn Finazzo M.A., CCC-SLP 07/14/2023, 9:50 AM

## 2023-07-14 NOTE — Plan of Care (Signed)
  Problem: Education: Goal: Knowledge of General Education information will improve Description: Including pain rating scale, medication(s)/side effects and non-pharmacologic comfort measures Outcome: Progressing   Problem: Education: Goal: Knowledge of disease or condition will improve Outcome: Progressing Goal: Knowledge of secondary prevention will improve (MUST DOCUMENT ALL) Outcome: Progressing   Problem: Ischemic Stroke/TIA Tissue Perfusion: Goal: Complications of ischemic stroke/TIA will be minimized Outcome: Progressing   Problem: Coping: Goal: Will verbalize positive feelings about self Outcome: Progressing Goal: Will identify appropriate support needs Outcome: Progressing   Problem: Health Behavior/Discharge Planning: Goal: Ability to manage health-related needs will improve Outcome: Progressing Goal: Goals will be collaboratively established with patient/family Outcome: Progressing   Problem: Self-Care: Goal: Ability to participate in self-care as condition permits will improve Outcome: Progressing Goal: Verbalization of feelings and concerns over difficulty with self-care will improve Outcome: Progressing Goal: Ability to communicate needs accurately will improve Outcome: Progressing   Problem: Nutrition: Goal: Risk of aspiration will decrease Outcome: Progressing

## 2023-07-14 NOTE — Care Management Important Message (Signed)
 Important Message  Patient Details  Name: Terry Vasquez MRN: [id number] Date of Birth: March 16, 1940   Important Message Given:  Yes - Medicare IM     Vonzell Arrie Sharps 07/14/2023, 10:03 AM

## 2023-07-14 NOTE — Discharge Instructions (Signed)

## 2023-07-15 LAB — POCT ACTIVATED CLOTTING TIME: Activated Clotting Time: 279 s

## 2023-07-21 DIAGNOSIS — G459 Transient cerebral ischemic attack, unspecified: Secondary | ICD-10-CM | POA: Diagnosis not present

## 2023-07-21 DIAGNOSIS — N1832 Chronic kidney disease, stage 3b: Secondary | ICD-10-CM | POA: Diagnosis not present

## 2023-07-21 DIAGNOSIS — Z95828 Presence of other vascular implants and grafts: Secondary | ICD-10-CM | POA: Diagnosis not present

## 2023-07-21 DIAGNOSIS — I779 Disorder of arteries and arterioles, unspecified: Secondary | ICD-10-CM | POA: Diagnosis not present

## 2023-07-21 DIAGNOSIS — Z6822 Body mass index (BMI) 22.0-22.9, adult: Secondary | ICD-10-CM | POA: Diagnosis not present

## 2023-07-21 DIAGNOSIS — Z789 Other specified health status: Secondary | ICD-10-CM | POA: Diagnosis not present

## 2023-07-22 DIAGNOSIS — N183 Chronic kidney disease, stage 3 unspecified: Secondary | ICD-10-CM | POA: Diagnosis not present

## 2023-07-22 DIAGNOSIS — D631 Anemia in chronic kidney disease: Secondary | ICD-10-CM | POA: Diagnosis not present

## 2023-07-26 DIAGNOSIS — J61 Pneumoconiosis due to asbestos and other mineral fibers: Secondary | ICD-10-CM | POA: Diagnosis not present

## 2023-07-26 DIAGNOSIS — R918 Other nonspecific abnormal finding of lung field: Secondary | ICD-10-CM | POA: Diagnosis not present

## 2023-07-26 DIAGNOSIS — J301 Allergic rhinitis due to pollen: Secondary | ICD-10-CM | POA: Diagnosis not present

## 2023-07-26 DIAGNOSIS — J449 Chronic obstructive pulmonary disease, unspecified: Secondary | ICD-10-CM | POA: Diagnosis not present

## 2023-08-04 ENCOUNTER — Other Ambulatory Visit: Payer: Self-pay

## 2023-08-04 DIAGNOSIS — I6522 Occlusion and stenosis of left carotid artery: Secondary | ICD-10-CM

## 2023-08-05 DIAGNOSIS — D631 Anemia in chronic kidney disease: Secondary | ICD-10-CM | POA: Diagnosis not present

## 2023-08-05 DIAGNOSIS — N184 Chronic kidney disease, stage 4 (severe): Secondary | ICD-10-CM | POA: Diagnosis not present

## 2023-08-12 DIAGNOSIS — I1310 Hypertensive heart and chronic kidney disease without heart failure, with stage 1 through stage 4 chronic kidney disease, or unspecified chronic kidney disease: Secondary | ICD-10-CM | POA: Diagnosis not present

## 2023-08-13 DIAGNOSIS — E538 Deficiency of other specified B group vitamins: Secondary | ICD-10-CM | POA: Diagnosis not present

## 2023-08-13 DIAGNOSIS — I1310 Hypertensive heart and chronic kidney disease without heart failure, with stage 1 through stage 4 chronic kidney disease, or unspecified chronic kidney disease: Secondary | ICD-10-CM | POA: Diagnosis not present

## 2023-08-19 DIAGNOSIS — D631 Anemia in chronic kidney disease: Secondary | ICD-10-CM | POA: Diagnosis not present

## 2023-08-19 DIAGNOSIS — N184 Chronic kidney disease, stage 4 (severe): Secondary | ICD-10-CM | POA: Diagnosis not present

## 2023-08-20 ENCOUNTER — Encounter

## 2023-08-20 ENCOUNTER — Encounter (HOSPITAL_COMMUNITY)

## 2023-09-01 DIAGNOSIS — E1169 Type 2 diabetes mellitus with other specified complication: Secondary | ICD-10-CM | POA: Diagnosis not present

## 2023-09-01 DIAGNOSIS — E1159 Type 2 diabetes mellitus with other circulatory complications: Secondary | ICD-10-CM | POA: Diagnosis not present

## 2023-09-01 DIAGNOSIS — E1129 Type 2 diabetes mellitus with other diabetic kidney complication: Secondary | ICD-10-CM | POA: Diagnosis not present

## 2023-09-02 DIAGNOSIS — N184 Chronic kidney disease, stage 4 (severe): Secondary | ICD-10-CM | POA: Diagnosis not present

## 2023-09-02 DIAGNOSIS — R918 Other nonspecific abnormal finding of lung field: Secondary | ICD-10-CM | POA: Diagnosis not present

## 2023-09-02 DIAGNOSIS — J439 Emphysema, unspecified: Secondary | ICD-10-CM | POA: Diagnosis not present

## 2023-09-02 DIAGNOSIS — D631 Anemia in chronic kidney disease: Secondary | ICD-10-CM | POA: Diagnosis not present

## 2023-09-06 DIAGNOSIS — E1169 Type 2 diabetes mellitus with other specified complication: Secondary | ICD-10-CM | POA: Diagnosis not present

## 2023-09-06 DIAGNOSIS — Z6820 Body mass index (BMI) 20.0-20.9, adult: Secondary | ICD-10-CM | POA: Diagnosis not present

## 2023-09-06 DIAGNOSIS — E1159 Type 2 diabetes mellitus with other circulatory complications: Secondary | ICD-10-CM | POA: Diagnosis not present

## 2023-09-06 DIAGNOSIS — I152 Hypertension secondary to endocrine disorders: Secondary | ICD-10-CM | POA: Diagnosis not present

## 2023-09-06 DIAGNOSIS — E785 Hyperlipidemia, unspecified: Secondary | ICD-10-CM | POA: Diagnosis not present

## 2023-09-07 DIAGNOSIS — J61 Pneumoconiosis due to asbestos and other mineral fibers: Secondary | ICD-10-CM | POA: Diagnosis not present

## 2023-09-07 DIAGNOSIS — J301 Allergic rhinitis due to pollen: Secondary | ICD-10-CM | POA: Diagnosis not present

## 2023-09-07 DIAGNOSIS — J449 Chronic obstructive pulmonary disease, unspecified: Secondary | ICD-10-CM | POA: Diagnosis not present

## 2023-09-12 DIAGNOSIS — I1310 Hypertensive heart and chronic kidney disease without heart failure, with stage 1 through stage 4 chronic kidney disease, or unspecified chronic kidney disease: Secondary | ICD-10-CM | POA: Diagnosis not present

## 2023-09-13 DIAGNOSIS — I1310 Hypertensive heart and chronic kidney disease without heart failure, with stage 1 through stage 4 chronic kidney disease, or unspecified chronic kidney disease: Secondary | ICD-10-CM | POA: Diagnosis not present

## 2023-09-13 DIAGNOSIS — E538 Deficiency of other specified B group vitamins: Secondary | ICD-10-CM | POA: Diagnosis not present

## 2023-09-16 DIAGNOSIS — D631 Anemia in chronic kidney disease: Secondary | ICD-10-CM | POA: Diagnosis not present

## 2023-09-16 DIAGNOSIS — N184 Chronic kidney disease, stage 4 (severe): Secondary | ICD-10-CM | POA: Diagnosis not present

## 2023-09-17 ENCOUNTER — Ambulatory Visit: Attending: Vascular Surgery | Admitting: Physician Assistant

## 2023-09-17 ENCOUNTER — Ambulatory Visit (HOSPITAL_COMMUNITY)
Admission: RE | Admit: 2023-09-17 | Discharge: 2023-09-17 | Disposition: A | Discharge status: Home / Self Care | Source: Ambulatory Visit | Attending: Vascular Surgery | Admitting: Vascular Surgery

## 2023-09-17 VITALS — BP 186/80 | HR 68 | Temp 97.7°F | Wt 156.6 lb

## 2023-09-17 DIAGNOSIS — I6522 Occlusion and stenosis of left carotid artery: Secondary | ICD-10-CM | POA: Diagnosis not present

## 2023-09-17 DIAGNOSIS — G459 Transient cerebral ischemic attack, unspecified: Secondary | ICD-10-CM

## 2023-09-17 NOTE — Progress Notes (Signed)
 POST OPERATIVE OFFICE NOTE    CC:  F/u for surgery  HPI:  This is a 83 y.o. male who is s/p Left transcarotid artery revascularization on 07/13/23 by Dr. Pearline. This was for symptomatic left ICA stenosis. He had presented initially with right facial drooping. Right ICA stenosis was minimal on carotid duplex however Neurology felt that his symptoms were consisted with left hemispheric TIA so recommended carotid intervention. He tolerated the surgery well and was discharged home POD#1.   Pt returns today for follow up. Pt states he has been doing really well. He denies any visual changes, slurred speech, facial drooping, unilateral upper or lower extremity weakness or numbness. He is medically managed on Aspirin  and Statin. Was placed on Plavix  x 1 month per Neurology. He would like to discontinue this after today's visit. His blood pressure was very elevated today. He reports that it usually is high in the mornings but then it gets very low in the evenings. He checks it 3-4x per day and the results are sent to his PCP who is monitoring it.   Allergies  Allergen Reactions   Motrin [Ibuprofen] Other (See Comments)    Nosebleed, loss of consiousness   Actos [Pioglitazone] Other (See Comments)    Hypoglycemia, Dehydration    Glipizide Other (See Comments)    Hypoglycemia, Dehydration     Janumet [Sitagliptin Phos-Metformin Hcl] Other (See Comments)    Hypoglycemia, Dehydration     Jardiance [Empagliflozin] Other (See Comments)    Hypoglycemia, Dehydration     Metformin And Related Other (See Comments)    Reaction type/severity unknown   Trulicity [Dulaglutide] Nausea And Vomiting    Current Outpatient Medications  Medication Sig Dispense Refill   albuterol  (VENTOLIN  HFA) 108 (90 Base) MCG/ACT inhaler Inhale 1-2 puffs into the lungs every 6 (six) hours as needed for wheezing or shortness of breath.     aspirin  EC 81 MG tablet Take 1 tablet (81 mg total) by mouth daily. Swallow whole.      bumetanide (BUMEX) 0.5 MG tablet Take 0.5 mg by mouth daily.     CALCIUM  PO Take 1 tablet by mouth daily. Strength unknown     cholecalciferol (VITAMIN D3) 10 MCG (400 UNIT) TABS tablet Take 400 Units by mouth daily.     clopidogrel  (PLAVIX ) 75 MG tablet Take 75 mg by mouth daily.     doxazosin  (CARDURA ) 4 MG tablet Take 4 mg by mouth daily.     finasteride  (PROSCAR ) 5 MG tablet Take 5 mg by mouth daily.     fluticasone furoate-vilanterol (BREO ELLIPTA) 200-25 MCG/ACT AEPB Inhale 1 puff into the lungs daily.     JANUVIA 50 MG tablet Take 50 mg by mouth daily.     lisinopril  (ZESTRIL ) 5 MG tablet Take 5 mg by mouth 2 (two) times daily.     nateglinide (STARLIX) 120 MG tablet Take 60 mg by mouth 3 (three) times daily with meals.     Omega-3 1000 MG CAPS Take 1,000 mg by mouth daily.     rosuvastatin  (CRESTOR ) 10 MG tablet 10 mg daily.     No current facility-administered medications for this visit.     ROS:  See HPI  Physical Exam:  Vitals:   09/17/23 1318 09/17/23 1320  BP: (!) 170/72 (!) 186/80  Pulse: 68   Temp: 97.7 F (36.5 C)    General: WDWN, very pleasant elderly male, in no distress Incision:  Left neck incision well healed Cardiac: regular; no carotid  bruits Lungs: non labored Extremities:  moving all extremities without deficit. 2+ radial pulse, 2+ DP pulses Neuro: alert and oriented. Speech coherent  Non invasive Vascular lab: VAS US  Carotid duplex: Summary:  Right Carotid: Velocities in the right ICA are consistent with a 1-39% stenosis.   Left Carotid: The ECA appears >50% stenosed. Patent LICA stent.   Vertebrals:  Bilateral vertebral arteries demonstrate antegrade flow.  Subclavians: Normal flow hemodynamics were seen in bilateral subclavian arteries.    Assessment/Plan:  This is a 83 y.o. male who is s/p: left TCAR on 07/13/23 by Dr. Pearline. He is without any TIA or stroke like symptoms. His neck incision has healed well.  Duplex today shows right ICA  stenosis of 1-39%. Left ICA stent patient. Some elevated velocities centrally but no appreciable stenosis. Normal flow in the vertebral and subclavian arteries. - Okay to discontinue Plavix  at this time. Continue Aspirin  and Statin - Follow up in 9 months with repeat carotid duplex   Curry Damme, [facility] [phone]   Clinic MD:  Pearline

## 2023-09-30 DIAGNOSIS — D631 Anemia in chronic kidney disease: Secondary | ICD-10-CM | POA: Diagnosis not present

## 2023-09-30 DIAGNOSIS — N184 Chronic kidney disease, stage 4 (severe): Secondary | ICD-10-CM | POA: Diagnosis not present

## 2023-10-12 DIAGNOSIS — I1310 Hypertensive heart and chronic kidney disease without heart failure, with stage 1 through stage 4 chronic kidney disease, or unspecified chronic kidney disease: Secondary | ICD-10-CM | POA: Diagnosis not present

## 2023-10-12 DIAGNOSIS — I129 Hypertensive chronic kidney disease with stage 1 through stage 4 chronic kidney disease, or unspecified chronic kidney disease: Secondary | ICD-10-CM | POA: Diagnosis not present

## 2023-10-12 DIAGNOSIS — E1169 Type 2 diabetes mellitus with other specified complication: Secondary | ICD-10-CM | POA: Diagnosis not present

## 2023-10-12 DIAGNOSIS — E1122 Type 2 diabetes mellitus with diabetic chronic kidney disease: Secondary | ICD-10-CM | POA: Diagnosis not present

## 2023-10-12 DIAGNOSIS — E785 Hyperlipidemia, unspecified: Secondary | ICD-10-CM | POA: Diagnosis not present

## 2023-10-12 DIAGNOSIS — N183 Chronic kidney disease, stage 3 unspecified: Secondary | ICD-10-CM | POA: Diagnosis not present

## 2023-10-12 DIAGNOSIS — I6522 Occlusion and stenosis of left carotid artery: Secondary | ICD-10-CM | POA: Diagnosis not present

## 2023-10-12 DIAGNOSIS — E1140 Type 2 diabetes mellitus with diabetic neuropathy, unspecified: Secondary | ICD-10-CM | POA: Diagnosis not present

## 2023-10-13 DIAGNOSIS — I1310 Hypertensive heart and chronic kidney disease without heart failure, with stage 1 through stage 4 chronic kidney disease, or unspecified chronic kidney disease: Secondary | ICD-10-CM | POA: Diagnosis not present

## 2023-10-13 DIAGNOSIS — E538 Deficiency of other specified B group vitamins: Secondary | ICD-10-CM | POA: Diagnosis not present

## 2023-10-14 DIAGNOSIS — D631 Anemia in chronic kidney disease: Secondary | ICD-10-CM | POA: Diagnosis not present

## 2023-10-14 DIAGNOSIS — N184 Chronic kidney disease, stage 4 (severe): Secondary | ICD-10-CM | POA: Diagnosis not present

## 2023-10-22 DIAGNOSIS — Z20822 Contact with and (suspected) exposure to covid-19: Secondary | ICD-10-CM | POA: Diagnosis not present

## 2023-10-22 DIAGNOSIS — Z6823 Body mass index (BMI) 23.0-23.9, adult: Secondary | ICD-10-CM | POA: Diagnosis not present

## 2023-10-22 DIAGNOSIS — J101 Influenza due to other identified influenza virus with other respiratory manifestations: Secondary | ICD-10-CM | POA: Diagnosis not present

## 2023-10-22 DIAGNOSIS — R059 Cough, unspecified: Secondary | ICD-10-CM | POA: Diagnosis not present

## 2023-10-26 DIAGNOSIS — R351 Nocturia: Secondary | ICD-10-CM | POA: Diagnosis not present

## 2023-10-26 DIAGNOSIS — N401 Enlarged prostate with lower urinary tract symptoms: Secondary | ICD-10-CM | POA: Diagnosis not present

## 2023-10-26 DIAGNOSIS — R972 Elevated prostate specific antigen [PSA]: Secondary | ICD-10-CM | POA: Diagnosis not present

## 2023-10-28 DIAGNOSIS — J069 Acute upper respiratory infection, unspecified: Secondary | ICD-10-CM | POA: Diagnosis not present

## 2023-10-28 DIAGNOSIS — N184 Chronic kidney disease, stage 4 (severe): Secondary | ICD-10-CM | POA: Diagnosis not present

## 2023-10-28 DIAGNOSIS — D631 Anemia in chronic kidney disease: Secondary | ICD-10-CM | POA: Diagnosis not present

## 2023-11-11 DIAGNOSIS — D631 Anemia in chronic kidney disease: Secondary | ICD-10-CM | POA: Diagnosis not present

## 2023-11-11 DIAGNOSIS — N184 Chronic kidney disease, stage 4 (severe): Secondary | ICD-10-CM | POA: Diagnosis not present

## 2023-11-12 DIAGNOSIS — E1159 Type 2 diabetes mellitus with other circulatory complications: Secondary | ICD-10-CM | POA: Diagnosis not present

## 2023-11-12 DIAGNOSIS — I1310 Hypertensive heart and chronic kidney disease without heart failure, with stage 1 through stage 4 chronic kidney disease, or unspecified chronic kidney disease: Secondary | ICD-10-CM | POA: Diagnosis not present

## 2023-11-13 DIAGNOSIS — E538 Deficiency of other specified B group vitamins: Secondary | ICD-10-CM | POA: Diagnosis not present

## 2023-11-13 DIAGNOSIS — I1310 Hypertensive heart and chronic kidney disease without heart failure, with stage 1 through stage 4 chronic kidney disease, or unspecified chronic kidney disease: Secondary | ICD-10-CM | POA: Diagnosis not present

## 2023-11-25 DIAGNOSIS — D631 Anemia in chronic kidney disease: Secondary | ICD-10-CM | POA: Diagnosis not present

## 2023-11-25 DIAGNOSIS — N184 Chronic kidney disease, stage 4 (severe): Secondary | ICD-10-CM | POA: Diagnosis not present

## 2023-12-08 DIAGNOSIS — N184 Chronic kidney disease, stage 4 (severe): Secondary | ICD-10-CM | POA: Diagnosis not present

## 2023-12-08 DIAGNOSIS — D631 Anemia in chronic kidney disease: Secondary | ICD-10-CM | POA: Diagnosis not present

## 2023-12-12 DIAGNOSIS — E1159 Type 2 diabetes mellitus with other circulatory complications: Secondary | ICD-10-CM | POA: Diagnosis not present

## 2023-12-12 DIAGNOSIS — I1310 Hypertensive heart and chronic kidney disease without heart failure, with stage 1 through stage 4 chronic kidney disease, or unspecified chronic kidney disease: Secondary | ICD-10-CM | POA: Diagnosis not present

## 2023-12-13 DIAGNOSIS — E538 Deficiency of other specified B group vitamins: Secondary | ICD-10-CM | POA: Diagnosis not present

## 2023-12-13 DIAGNOSIS — I1310 Hypertensive heart and chronic kidney disease without heart failure, with stage 1 through stage 4 chronic kidney disease, or unspecified chronic kidney disease: Secondary | ICD-10-CM | POA: Diagnosis not present

## 2023-12-23 DIAGNOSIS — D631 Anemia in chronic kidney disease: Secondary | ICD-10-CM | POA: Diagnosis not present

## 2023-12-23 DIAGNOSIS — N184 Chronic kidney disease, stage 4 (severe): Secondary | ICD-10-CM | POA: Diagnosis not present

## 2024-01-28 DIAGNOSIS — R55 Syncope and collapse: Secondary | ICD-10-CM | POA: Insufficient documentation

## 2024-01-28 DIAGNOSIS — T8859XA Other complications of anesthesia, initial encounter: Secondary | ICD-10-CM | POA: Insufficient documentation

## 2024-01-28 DIAGNOSIS — K4090 Unilateral inguinal hernia, without obstruction or gangrene, not specified as recurrent: Secondary | ICD-10-CM | POA: Insufficient documentation

## 2024-02-02 NOTE — Progress Notes (Unsigned)
 " Cardiology Office Note:    Date:  02/03/2024   ID:  Terry Vasquez, DOB Mar 31, 1940, MRN [id number]  PCP:  Trinidad Glisson, MD  Cardiologist:  Redell Leiter, MD   Referring MD: Sherrilee Lorrene DEL, MD  ASSESSMENT:    1. Syncope, unspecified syncope type    PLAN:    In order of problems listed above:  History is quite suggestive of orthostatic hypotension especially as he takes an alpha-blocker in the morning and the episodes occur in the morning he tells me he has documented orthostatic hypotension I asked him to discontinue doxazosin  continue to trend his blood pressure standing and sitting Elevate the head of the bed 30 degrees at night which is helpful in attenuating early morning hypertension Continue his lisinopril  avoid orthostatic antihypertensive agents like hydralazine  amlodipine. Event monitor for completeness Continue his statin  Next appointment 3 months   Medication Adjustments/Labs and Tests Ordered: Current medicines are reviewed at length with the patient today.  Concerns regarding medicines are outlined above.  Orders Placed This Encounter  Procedures   EKG 12-Lead   No orders of the defined types were placed in this encounter.    No chief complaint on file.   History of Present Illness:    Terry Vasquez is a 84 y.o. male with a history of type 2 diabetes hypertension COPD carotid disease CKD who had left carotid endarterectomy TCAR performed 07/13/2023 who is being seen today for the evaluation of syncope at the request of [place], Lorrene DEL, MD.  He is known to me from caring for his wife for decades he is a retired naval architect in [place] He has obvious asbestosis from the CT done at [facility] ED as a consequence Following carotid surgery he has had episodes of orthostatic lightheadedness He had 3 syncopal episodes and 3 visits to the emergency room Do not have access but he tells me he has had a home blood pressure device that  transmits to his PCP office and has episodes of very high blood pressure and standing blood pressure of 70 to 90s systolic He was referred to see me about his orthostatic shift and blood pressure I read his meds and he is taking an alpha-blocker Cardura  is used at times for BPH but he tells me does not have that problem he said it was placed by nephrologist and it may be a treatment for hypertension. And mild office today standing he does not have an orthostatic shift of more than 10 mmHg He has not had chest pain edema palpitation all of his episodes have been orthostatic and tend occur in the morning after taking his alpha-blocker He has no known heart disease congenital rheumatic or atrial fibrillation He is not having edema orthopnea And echocardiogram in June without any finding to suggest cardiomyopathy   Past Medical History:  Diagnosis Date   Asbestosis (HCC) 07/10/2023   Carotid stenosis 03/01/2018   CKD (chronic kidney disease) stage 3, GFR 30-59 ml/min (HCC) 07/10/2023   Complication of anesthesia    Pt told not have any spinal tap procedures int he future   Controlled type 2 diabetes mellitus without complication, without long-term current use of insulin  (HCC) 07/10/2023   COPD (chronic obstructive pulmonary disease) (HCC)    Essential hypertension 07/10/2023   Inguinal hernia    Left carotid stenosis 07/10/2023   Syncope 01/28/2024    Past Surgical History:  Procedure Laterality Date   APPENDECTOMY     INGUINAL HERNIA REPAIR  SHOULDER SURGERY Right    TRANSCAROTID ARTERY REVASCULARIZATION  Left 07/13/2023   Procedure: TRANSCAROTID ARTERY REVASCULARIZATION (TCAR);  Surgeon: Pearline Norman RAMAN, MD;  Location: [facility] OR;  Service: Vascular;  Laterality: Left;    Current Medications: Active Medications[1]   Allergies:   Acetaminophen , Acetylcysteine, Iodinated contrast media, Motrin [ibuprofen], Propoxyphene, Actos [pioglitazone], Glipizide, Janumet [sitagliptin  phos-metformin hcl], Jardiance [empagliflozin], Insulin  glargine, Metformin and related, and Trulicity [dulaglutide]   Social History   Socioeconomic History   Marital status: Married    Spouse name: Not on file   Number of children: Not on file   Years of education: Not on file   Highest education level: Not on file  Occupational History   Not on file  Tobacco Use   Smoking status: Never   Smokeless tobacco: Never  Vaping Use   Vaping status: Never Used  Substance and Sexual Activity   Alcohol  use: Never   Drug use: Never   Sexual activity: Not on file  Other Topics Concern   Not on file  Social History Narrative   Not on file   Social Drivers of Health   Tobacco Use: Low Risk (02/03/2024)   Patient History    Smoking Tobacco Use: Never    Smokeless Tobacco Use: Never    Passive Exposure: Not on file  Financial Resource Strain: Not on file  Food Insecurity: No Food Insecurity (07/10/2023)   Epic    Worried About Programme Researcher, Broadcasting/film/video in the Last Year: Never true    Ran Out of Food in the Last Year: Never true  Transportation Needs: No Transportation Needs (07/10/2023)   Epic    Lack of Transportation (Medical): No    Lack of Transportation (Non-Medical): No  Physical Activity: Not on file  Stress: Not on file  Social Connections: Socially Integrated (07/10/2023)   Social Connection and Isolation Panel    Frequency of Communication with Friends and Family: More than three times a week    Frequency of Social Gatherings with Friends and Family: More than three times a week    Attends Religious Services: More than 4 times per year    Active Member of [employer] or Organizations: Yes    Attends Banker Meetings: More than 4 times per year    Marital Status: Married  Depression (PHQ2-9): Not on file  Alcohol  Screen: Not on file  Housing: Low Risk (07/10/2023)   Epic    Unable to Pay for Housing in the Last Year: No    Number of Times Moved in the Last Year: 0     Homeless in the Last Year: No  Utilities: Not At Risk (07/10/2023)   Epic    Threatened with loss of utilities: No  Health Literacy: Not on file     Family History: The patient's family history includes Diabetes in his mother. There is no history of Hypertension or Heart disease.  ROS:   ROS Please see the history of present illness.     All other systems reviewed and are negative.  EKGs/Labs/Other Studies Reviewed:    The following studies were reviewed today:   Cardiac Studies & Procedures   ______________________________________________________________________________________________     ECHOCARDIOGRAM  ECHOCARDIOGRAM COMPLETE 07/12/2023  Narrative ECHOCARDIOGRAM REPORT    Patient Name:   KACI FREEL Date of Exam: 07/12/2023 Medical Rec #:  [id number]          Height:       69.0 in Accession #:  [id number]         Weight:       158.0 lb Date of Birth:  07-20-1940           BSA:          1.869 m Patient Age:    83 years           BP:           135/71 mmHg Patient Gender: M                  HR:           59 bpm. Exam Location:  Inpatient  Procedure: 2D Echo, Cardiac Doppler and Color Doppler (Both Spectral and Color Flow Doppler were utilized during procedure).  Indications:    TIA  History:        Patient has prior history of Echocardiogram examinations, most recent 09/29/2021. Risk Factors:Hypertension.  Sonographer:    Therisa Crouch Referring Phys: [id number] MOHAMMAD L GARBA  IMPRESSIONS   1. Left ventricular ejection fraction, by estimation, is 60 to 65%. The left ventricle has normal function. The left ventricle has no regional wall motion abnormalities. There is mild concentric left ventricular hypertrophy. Left ventricular diastolic parameters are consistent with Grade I diastolic dysfunction (impaired relaxation). 2. Right ventricular systolic function is normal. The right ventricular size is normal. 3. Left atrial size was mildly dilated. 4. The  mitral valve is normal in structure. Mild mitral valve regurgitation. No evidence of mitral stenosis. 5. The aortic valve is calcified. There is mild calcification of the aortic valve. Aortic valve regurgitation is not visualized. No aortic stenosis is present. 6. The inferior vena cava is normal in size with greater than 50% respiratory variability, suggesting right atrial pressure of 3 mmHg.  FINDINGS Left Ventricle: Left ventricular ejection fraction, by estimation, is 60 to 65%. The left ventricle has normal function. The left ventricle has no regional wall motion abnormalities. The left ventricular internal cavity size was normal in size. There is mild concentric left ventricular hypertrophy. Left ventricular diastolic parameters are consistent with Grade I diastolic dysfunction (impaired relaxation).  Right Ventricle: The right ventricular size is normal. No increase in right ventricular wall thickness. Right ventricular systolic function is normal.  Left Atrium: Left atrial size was mildly dilated.  Right Atrium: Right atrial size was normal in size.  Pericardium: There is no evidence of pericardial effusion.  Mitral Valve: The mitral valve is normal in structure. Mild mitral valve regurgitation. No evidence of mitral valve stenosis.  Tricuspid Valve: The tricuspid valve is normal in structure. Tricuspid valve regurgitation is not demonstrated. No evidence of tricuspid stenosis.  Aortic Valve: The aortic valve is calcified. There is mild calcification of the aortic valve. Aortic valve regurgitation is not visualized. No aortic stenosis is present.  Pulmonic Valve: The pulmonic valve was normal in structure. Pulmonic valve regurgitation is not visualized. No evidence of pulmonic stenosis.  Aorta: The aortic root is normal in size and structure.  Venous: The inferior vena cava is normal in size with greater than 50% respiratory variability, suggesting right atrial pressure of 3  mmHg.  IAS/Shunts: No atrial level shunt detected by color flow Doppler.   LEFT VENTRICLE PLAX 2D LVIDd:         5.20 cm      Diastology LVIDs:         3.50 cm      LV e' medial:    7.18 cm/s LV PW:  1.20 cm      LV E/e' medial:  13.6 LV IVS:        1.30 cm      LV e' lateral:   6.53 cm/s LVOT diam:     2.00 cm      LV E/e' lateral: 14.9 LVOT Area:     3.14 cm  LV Volumes (MOD) LV vol d, MOD A2C: 93.2 ml LV vol d, MOD A4C: 120.0 ml LV vol s, MOD A2C: 30.2 ml LV vol s, MOD A4C: 42.0 ml LV SV MOD A2C:     63.0 ml LV SV MOD A4C:     120.0 ml LV SV MOD BP:      72.1 ml  RIGHT VENTRICLE             IVC RV Basal diam:  3.10 cm     IVC diam: 1.70 cm RV S prime:     12.60 cm/s TAPSE (M-mode): 2.5 cm  LEFT ATRIUM             Index LA diam:        4.10 cm 2.19 cm/m LA Vol (A2C):   68.6 ml 36.70 ml/m LA Vol (A4C):   60.3 ml 32.26 ml/m LA Biplane Vol: 64.5 ml 34.51 ml/m  AORTA Ao Root diam: 3.30 cm Ao Asc diam:  2.90 cm  MITRAL VALVE MV Area (PHT): 5.02 cm    SHUNTS MV Decel Time: 151 msec    Systemic Diam: 2.00 cm MV E velocity: 97.50 cm/s MV A velocity: 86.50 cm/s MV E/A ratio:  1.13  Aditya Sabharwal Electronically signed by Ria Commander Signature Date/Time: 07/12/2023/1:38:18 PM    Final          ______________________________________________________________________________________________     EKG Interpretation Date/Time:  Thursday February 03 2024 14:32:11 EST Ventricular Rate:  73 PR Interval:  116 QRS Duration:  82 QT Interval:  392 QTC Calculation: 431 R Axis:   -16  Text Interpretation: Normal sinus rhythm baseline wander Nonspecific ST and T wave abnormality No previous ECGs available Confirmed by Monetta Rogue ([id number]) on 02/03/2024 2:37:24 PM    Recent Labs: 07/14/2023: BUN 27; Creatinine, Ser 1.63; Hemoglobin 9.9; Platelets 241; Potassium 4.8; Sodium 133  Recent Lipid Panel    Component Value Date/Time   CHOL 104 07/11/2023 0259    TRIG 77 07/11/2023 0259   HDL 36 (L) 07/11/2023 0259   CHOLHDL 2.9 07/11/2023 0259   VLDL 15 07/11/2023 0259   LDLCALC 53 07/11/2023 0259    Physical Exam:    VS:  BP (!) 152/70   Pulse 73   Ht 5' 9 (1.753 m)   Wt 156 lb 6 oz (70.9 kg)   SpO2 96%   BMI 23.09 kg/m     Wt Readings from Last 3 Encounters:  02/03/24 156 lb 6 oz (70.9 kg)  09/17/23 156 lb 9.6 oz (71 kg)  07/13/23 158 lb (71.7 kg)     GEN: Tall thin man well nourished, well developed in no acute distress HEENT: Normal NECK: No JVD; No carotid bruits LYMPHATICS: No lymphadenopathy CARDIAC: RRR, no murmurs, rubs, gallops RESPIRATORY:  Clear to auscultation without rales, wheezing or rhonchi fibrotic rales at the bases left greater than right consistent with asbestos lung disease ABDOMEN: Soft, non-tender, non-distended MUSCULOSKELETAL:  No edema; No deformity  SKIN: Warm and dry NEUROLOGIC:  Alert and oriented x 3 PSYCHIATRIC:  Normal affect     Signed, Rogue Monetta, MD  02/03/2024 2:37 PM  [facility]     [1]  Current Meds  Medication Sig   albuterol  (VENTOLIN  HFA) 108 (90 Base) MCG/ACT inhaler Inhale 1-2 puffs into the lungs every 6 (six) hours as needed for wheezing or shortness of breath.   aspirin  EC 81 MG tablet Take 1 tablet (81 mg total) by mouth daily. Swallow whole.   bumetanide (BUMEX) 0.5 MG tablet Take 0.5 mg by mouth daily.   CALCIUM  PO Take 1 tablet by mouth daily. Strength unknown   cholecalciferol (VITAMIN D3) 10 MCG (400 UNIT) TABS tablet Take 1,000 Units by mouth daily.   clopidogrel  (PLAVIX ) 75 MG tablet Take 75 mg by mouth daily.   Continuous Glucose Receiver (DEXCOM G7 RECEIVER) DEVI 1 Device by Does not apply route every 14 (fourteen) days.   doxazosin  (CARDURA ) 4 MG tablet Take 4 mg by mouth daily.   epoetin  alfa-epbx (RETACRIT ) 2000 UNIT/ML injection 2,000 Units 3 (three) times a week.   finasteride  (PROSCAR ) 5 MG tablet Take 5 mg by mouth daily.    fluticasone furoate-vilanterol (BREO ELLIPTA) 200-25 MCG/ACT AEPB Inhale 1 puff into the lungs daily.   JANUVIA 50 MG tablet Take 50 mg by mouth daily.   lisinopril  (ZESTRIL ) 5 MG tablet Take 5 mg by mouth 2 (two) times daily.   mometasone (NASONEX 24HR) 50 MCG/ACT nasal spray Place 2 sprays into the nose as needed.   nateglinide (STARLIX) 120 MG tablet Take 60 mg by mouth 3 (three) times daily with meals.   Omega-3 1000 MG CAPS Take 1,000 mg by mouth daily.   rosuvastatin  (CRESTOR ) 10 MG tablet 10 mg daily.   "

## 2024-02-03 ENCOUNTER — Encounter: Payer: Self-pay | Admitting: Cardiology

## 2024-02-03 ENCOUNTER — Ambulatory Visit

## 2024-02-03 ENCOUNTER — Ambulatory Visit: Admitting: Cardiology

## 2024-02-03 VITALS — BP 152/70 | HR 73 | Ht 69.0 in | Wt 156.4 lb

## 2024-02-03 DIAGNOSIS — R55 Syncope and collapse: Secondary | ICD-10-CM | POA: Diagnosis not present

## 2024-02-03 NOTE — Patient Instructions (Addendum)
 "         Elevate the head of your bed 30 degrees. Medication Instructions:  Your physician has recommended you make the following change in your medication:   Stop doxazosin  (CARDURA )   *If you need a refill on your cardiac medications before your next appointment, please call your pharmacy*   Lab Work: None ordered If you have labs (blood work) drawn today and your tests are completely normal, you will receive your results only by: MyChart Message (if you have MyChart) OR A paper copy in the mail If you have any lab test that is abnormal or we need to change your treatment, we will call you to review the results.  Testing/Procedures:  A zio monitor was ordered today. It will remain on for 7 days. Remove 02/10/24. You will then return monitor and event diary in provided box. It takes 1-2 weeks for report to be downloaded and returned to us . We will call you with the results. If monitor falls off or has orange flashing light, please call Zio for further instructions.  Follow-Up: At [facility], you and your health needs are our priority.  As part of our continuing mission to provide you with exceptional heart care, we have created designated Provider Care Teams.  These Care Teams include your primary Cardiologist (physician) and Advanced Practice Providers (APPs -  Physician Assistants and Nurse Practitioners) who all work together to provide you with the care you need, when you need it.  We recommend signing up for the patient portal called MyChart.  Sign up information is provided on this After Visit Summary.  MyChart is used to connect with patients for Virtual Visits (Telemedicine).  Patients are able to view lab/test results, encounter notes, upcoming appointments, etc.  Non-urgent messages can be sent to your provider as well.   To learn more about what you can do with MyChart, go to [email or web address].    Your next appointment:   3 month(s)  The format for your next  appointment:   In Person  Provider:   Redell Leiter, MD   Other Instructions Echocardiogram An echocardiogram is a test that uses sound waves (ultrasound) to produce images of the heart. Images from an echocardiogram can provide important information about: Heart size and shape. The size and thickness and movement of your heart's walls. Heart muscle function and strength. Heart valve function or if you have stenosis. Stenosis is when the heart valves are too narrow. If blood is flowing backward through the heart valves (regurgitation). A tumor or infectious growth around the heart valves. Areas of heart muscle that are not working well because of poor blood flow or injury from a heart attack. Aneurysm detection. An aneurysm is a weak or damaged part of an artery wall. The wall bulges out from the normal force of blood pumping through the body. Tell a health care provider about: Any allergies you have. All medicines you are taking, including vitamins, herbs, eye drops, creams, and over-the-counter medicines. Any blood disorders you have. Any surgeries you have had. Any medical conditions you have. Whether you are pregnant or may be pregnant. What are the risks? Generally, this is a safe test. However, problems may occur, including an allergic reaction to dye (contrast) that may be used during the test. What happens before the test? No specific preparation is needed. You may eat and drink normally. What happens during the test? You will take off your clothes from the waist up and put on a  hospital gown. Electrodes or electrocardiogram (ECG)patches may be placed on your chest. The electrodes or patches are then connected to a device that monitors your heart rate and rhythm. You will lie down on a table for an ultrasound exam. A gel will be applied to your chest to help sound waves pass through your skin. A handheld device, called a transducer, will be pressed against your chest and  moved over your heart. The transducer produces sound waves that travel to your heart and bounce back (or echo back) to the transducer. These sound waves will be captured in real-time and changed into images of your heart that can be viewed on a video monitor. The images will be recorded on a computer and reviewed by your health care provider. You may be asked to change positions or hold your breath for a short time. This makes it easier to get different views or better views of your heart. In some cases, you may receive contrast through an IV in one of your veins. This can improve the quality of the pictures from your heart. The procedure may vary among health care providers and hospitals.   What can I expect after the test? You may return to your normal, everyday life, including diet, activities, and medicines, unless your health care provider tells you not to do that. Follow these instructions at home: It is up to you to get the results of your test. Ask your health care provider, or the department that is doing the test, when your results will be ready. Keep all follow-up visits. This is important. Summary An echocardiogram is a test that uses sound waves (ultrasound) to produce images of the heart. Images from an echocardiogram can provide important information about the size and shape of your heart, heart muscle function, heart valve function, and other possible heart problems. You do not need to do anything to prepare before this test. You may eat and drink normally. After the echocardiogram is completed, you may return to your normal, everyday life, unless your health care provider tells you not to do that. This information is not intended to replace advice given to you by your health care provider. Make sure you discuss any questions you have with your health care provider. Document Revised: 08/22/2019 Document Reviewed: 08/22/2019 [employer] Patient Education  2021 [employer]   Important Information About Sugar        "

## 2024-02-11 ENCOUNTER — Ambulatory Visit (INDEPENDENT_AMBULATORY_CARE_PROVIDER_SITE_OTHER)
Admission: RE | Admit: 2024-02-11 | Discharge: 2024-02-11 | Disposition: A | Source: Ambulatory Visit | Attending: Family Medicine | Admitting: Family Medicine

## 2024-02-11 ENCOUNTER — Other Ambulatory Visit (HOSPITAL_BASED_OUTPATIENT_CLINIC_OR_DEPARTMENT_OTHER): Payer: Self-pay | Admitting: Family Medicine

## 2024-02-11 DIAGNOSIS — M79645 Pain in left finger(s): Secondary | ICD-10-CM

## 2024-02-11 DIAGNOSIS — S6990XA Unspecified injury of unspecified wrist, hand and finger(s), initial encounter: Secondary | ICD-10-CM
